# Patient Record
Sex: Male | Born: 1965 | Race: White | Hispanic: No | Marital: Married | Smoking: Current every day smoker
Health system: Southern US, Community
[De-identification: ages and names within clinical notes are randomized; demographics above are authoritative.]

## PROBLEM LIST (undated history)

## (undated) DIAGNOSIS — K219 Gastro-esophageal reflux disease without esophagitis: Secondary | ICD-10-CM

## (undated) DIAGNOSIS — Z720 Tobacco use: Secondary | ICD-10-CM

## (undated) DIAGNOSIS — M4180 Other forms of scoliosis, site unspecified: Secondary | ICD-10-CM

## (undated) DIAGNOSIS — G4733 Obstructive sleep apnea (adult) (pediatric): Secondary | ICD-10-CM

## (undated) DIAGNOSIS — Z955 Presence of coronary angioplasty implant and graft: Secondary | ICD-10-CM

## (undated) DIAGNOSIS — J449 Chronic obstructive pulmonary disease, unspecified: Secondary | ICD-10-CM

## (undated) DIAGNOSIS — I10 Essential (primary) hypertension: Secondary | ICD-10-CM

## (undated) DIAGNOSIS — I214 Non-ST elevation (NSTEMI) myocardial infarction: Secondary | ICD-10-CM

## (undated) DIAGNOSIS — I2510 Atherosclerotic heart disease of native coronary artery without angina pectoris: Secondary | ICD-10-CM

## (undated) DIAGNOSIS — E7800 Pure hypercholesterolemia, unspecified: Secondary | ICD-10-CM

## (undated) DIAGNOSIS — M1990 Unspecified osteoarthritis, unspecified site: Secondary | ICD-10-CM

## (undated) HISTORY — PX: BACK SURGERY: SHX140

## (undated) HISTORY — PX: KNEE SURGERY: SHX244

## (undated) HISTORY — DX: Tobacco use: Z72.0

## (undated) HISTORY — DX: Atherosclerotic heart disease of native coronary artery without angina pectoris: I25.10

## (undated) HISTORY — DX: Obstructive sleep apnea (adult) (pediatric): G47.33

## (undated) HISTORY — DX: Gastro-esophageal reflux disease without esophagitis: K21.9

## (undated) HISTORY — DX: Non-ST elevation (NSTEMI) myocardial infarction: I21.4

## (undated) HISTORY — DX: Other forms of scoliosis, site unspecified: M41.80

## (undated) HISTORY — DX: Chronic obstructive pulmonary disease, unspecified: J44.9

## (undated) HISTORY — PX: JOINT REPLACEMENT: SHX530

---

## 2005-05-04 ENCOUNTER — Ambulatory Visit: Payer: Self-pay | Admitting: Nurse Practitioner

## 2007-06-17 ENCOUNTER — Ambulatory Visit: Payer: Self-pay | Admitting: Internal Medicine

## 2007-08-21 ENCOUNTER — Encounter: Payer: Self-pay | Admitting: Family Medicine

## 2007-08-22 ENCOUNTER — Encounter: Payer: Self-pay | Admitting: Family Medicine

## 2007-09-22 ENCOUNTER — Encounter: Payer: Self-pay | Admitting: Family Medicine

## 2008-12-13 ENCOUNTER — Emergency Department: Payer: Self-pay | Admitting: Internal Medicine

## 2010-01-01 ENCOUNTER — Emergency Department: Payer: Self-pay | Admitting: Unknown Physician Specialty

## 2010-04-04 ENCOUNTER — Emergency Department: Payer: Self-pay | Admitting: Emergency Medicine

## 2010-08-31 ENCOUNTER — Emergency Department: Payer: Self-pay | Admitting: Emergency Medicine

## 2011-01-02 DIAGNOSIS — J159 Unspecified bacterial pneumonia: Secondary | ICD-10-CM | POA: Insufficient documentation

## 2011-01-02 DIAGNOSIS — E782 Mixed hyperlipidemia: Secondary | ICD-10-CM | POA: Insufficient documentation

## 2011-01-02 DIAGNOSIS — K2100 Gastro-esophageal reflux disease with esophagitis, without bleeding: Secondary | ICD-10-CM | POA: Insufficient documentation

## 2011-04-27 ENCOUNTER — Inpatient Hospital Stay: Payer: Self-pay | Admitting: Student

## 2011-04-27 DIAGNOSIS — R079 Chest pain, unspecified: Secondary | ICD-10-CM

## 2011-04-28 ENCOUNTER — Inpatient Hospital Stay (HOSPITAL_COMMUNITY)
Admission: AD | Admit: 2011-04-28 | Discharge: 2011-05-05 | DRG: 109 | Disposition: A | Discharge status: Home / Self Care | Payer: BC Managed Care – PPO | Source: Other Acute Inpatient Hospital | Attending: Cardiothoracic Surgery | Admitting: Cardiothoracic Surgery

## 2011-04-28 DIAGNOSIS — J4489 Other specified chronic obstructive pulmonary disease: Secondary | ICD-10-CM | POA: Diagnosis present

## 2011-04-28 DIAGNOSIS — R079 Chest pain, unspecified: Secondary | ICD-10-CM

## 2011-04-28 DIAGNOSIS — I2510 Atherosclerotic heart disease of native coronary artery without angina pectoris: Secondary | ICD-10-CM | POA: Diagnosis present

## 2011-04-28 DIAGNOSIS — K219 Gastro-esophageal reflux disease without esophagitis: Secondary | ICD-10-CM | POA: Diagnosis present

## 2011-04-28 DIAGNOSIS — J449 Chronic obstructive pulmonary disease, unspecified: Secondary | ICD-10-CM | POA: Diagnosis present

## 2011-04-28 DIAGNOSIS — F17200 Nicotine dependence, unspecified, uncomplicated: Secondary | ICD-10-CM | POA: Diagnosis present

## 2011-04-28 DIAGNOSIS — E8779 Other fluid overload: Secondary | ICD-10-CM | POA: Diagnosis not present

## 2011-04-28 DIAGNOSIS — G4733 Obstructive sleep apnea (adult) (pediatric): Secondary | ICD-10-CM | POA: Diagnosis present

## 2011-04-28 DIAGNOSIS — D62 Acute posthemorrhagic anemia: Secondary | ICD-10-CM | POA: Diagnosis not present

## 2011-04-28 DIAGNOSIS — I214 Non-ST elevation (NSTEMI) myocardial infarction: Principal | ICD-10-CM | POA: Diagnosis present

## 2011-04-28 DIAGNOSIS — D696 Thrombocytopenia, unspecified: Secondary | ICD-10-CM | POA: Diagnosis not present

## 2011-04-28 LAB — PROTIME-INR
INR: 0.95 (ref 0.00–1.49)
Prothrombin Time: 12.9 seconds (ref 11.6–15.2)

## 2011-04-28 LAB — CBC
Platelets: 181 10*3/uL (ref 150–400)
RBC: 4.68 MIL/uL (ref 4.22–5.81)
RDW: 12.6 % (ref 11.5–15.5)
WBC: 9.3 10*3/uL (ref 4.0–10.5)

## 2011-04-28 LAB — APTT: aPTT: 29 seconds (ref 24–37)

## 2011-04-28 LAB — DIFFERENTIAL
Basophils Absolute: 0.1 10*3/uL (ref 0.0–0.1)
Eosinophils Absolute: 0.3 10*3/uL (ref 0.0–0.7)
Eosinophils Relative: 3 % (ref 0–5)
Lymphs Abs: 1.9 10*3/uL (ref 0.7–4.0)
Neutrophils Relative %: 69 % (ref 43–77)

## 2011-04-29 DIAGNOSIS — I2510 Atherosclerotic heart disease of native coronary artery without angina pectoris: Secondary | ICD-10-CM

## 2011-04-29 LAB — URINALYSIS, MICROSCOPIC ONLY
Glucose, UA: NEGATIVE mg/dL
Ketones, ur: NEGATIVE mg/dL
Leukocytes, UA: NEGATIVE
Nitrite: NEGATIVE
Protein, ur: NEGATIVE mg/dL
Urobilinogen, UA: 1 mg/dL (ref 0.0–1.0)

## 2011-04-29 LAB — BASIC METABOLIC PANEL
Calcium: 8.6 mg/dL (ref 8.4–10.5)
Creatinine, Ser: 1.09 mg/dL (ref 0.50–1.35)
GFR calc Af Amer: >60 mL/min (ref >60)
GFR calc non Af Amer: >60 mL/min (ref >60)
Sodium: 140 mEq/L (ref 135–145)

## 2011-04-30 ENCOUNTER — Inpatient Hospital Stay (HOSPITAL_COMMUNITY): Payer: BC Managed Care – PPO

## 2011-04-30 DIAGNOSIS — Z01810 Encounter for preprocedural cardiovascular examination: Secondary | ICD-10-CM

## 2011-04-30 LAB — COMPREHENSIVE METABOLIC PANEL
Alkaline Phosphatase: 71 U/L (ref 39–117)
BUN: 18 mg/dL (ref 6–23)
Creatinine, Ser: 0.99 mg/dL (ref 0.50–1.35)
GFR calc Af Amer: >60 mL/min (ref >60)
Glucose, Bld: 115 mg/dL — ABNORMAL HIGH (ref 70–99)
Potassium: 4.1 mEq/L (ref 3.5–5.1)
Total Bilirubin: 0.3 mg/dL (ref 0.3–1.2)
Total Protein: 6.5 g/dL (ref 6.0–8.3)

## 2011-04-30 LAB — TYPE AND SCREEN: Antibody Screen: NEGATIVE

## 2011-04-30 LAB — BLOOD GAS, ARTERIAL
Acid-Base Excess: 3.5 mmol/L — ABNORMAL HIGH (ref 0.0–2.0)
Drawn by: 30575
O2 Saturation: 94.9 %
Patient temperature: 98.6
pO2, Arterial: 71.7 mmHg — ABNORMAL LOW (ref 80.0–100.0)

## 2011-04-30 LAB — LIPID PANEL: Cholesterol: 190 mg/dL (ref 0–200)

## 2011-05-01 ENCOUNTER — Inpatient Hospital Stay (HOSPITAL_COMMUNITY): Payer: BC Managed Care – PPO

## 2011-05-01 DIAGNOSIS — I2510 Atherosclerotic heart disease of native coronary artery without angina pectoris: Secondary | ICD-10-CM

## 2011-05-01 HISTORY — PX: CORONARY ARTERY BYPASS GRAFT: SHX141

## 2011-05-01 LAB — POCT I-STAT 3, ART BLOOD GAS (G3+)
Acid-Base Excess: 1 mmol/L (ref 0.0–2.0)
Bicarbonate: 26.8 mEq/L — ABNORMAL HIGH (ref 20.0–24.0)
Bicarbonate: 26.9 mEq/L — ABNORMAL HIGH (ref 20.0–24.0)
O2 Saturation: 100 %
O2 Saturation: 94 %
O2 Saturation: 92 %
Patient temperature: 37.7
TCO2: 28 mmol/L (ref 0–100)
TCO2: 30 mmol/L (ref 0–100)
TCO2: 28 mmol/L (ref 0–100)
pCO2 arterial: 56.3 mmHg — ABNORMAL HIGH (ref 35.0–45.0)
pCO2 arterial: 52.8 mmHg — ABNORMAL HIGH (ref 35.0–45.0)
pCO2 arterial: 52.4 mmHg — ABNORMAL HIGH (ref 35.0–45.0)
pCO2 arterial: 46.9 mmHg — ABNORMAL HIGH (ref 35.0–45.0)
pH, Arterial: 7.321 — ABNORMAL LOW (ref 7.350–7.450)
pH, Arterial: 7.335 — ABNORMAL LOW (ref 7.350–7.450)
pH, Arterial: 7.311 — ABNORMAL LOW (ref 7.350–7.450)
pH, Arterial: 7.337 — ABNORMAL LOW (ref 7.350–7.450)
pO2, Arterial: 255 mmHg — ABNORMAL HIGH (ref 80.0–100.0)
pO2, Arterial: 350 mmHg — ABNORMAL HIGH (ref 80.0–100.0)
pO2, Arterial: 144 mmHg — ABNORMAL HIGH (ref 80.0–100.0)
pO2, Arterial: 73 mmHg — ABNORMAL LOW (ref 80.0–100.0)
pO2, Arterial: 73 mmHg — ABNORMAL LOW (ref 80.0–100.0)

## 2011-05-01 LAB — SURGICAL PCR SCREEN
MRSA, PCR: NEGATIVE
Staphylococcus aureus: NEGATIVE

## 2011-05-01 LAB — POCT I-STAT 4, (NA,K, GLUC, HGB,HCT)
Glucose, Bld: 118 mg/dL — ABNORMAL HIGH (ref 70–99)
Glucose, Bld: 153 mg/dL — ABNORMAL HIGH (ref 70–99)
HCT: 45 % (ref 39.0–52.0)
HCT: 40 % (ref 39.0–52.0)
HCT: 42 % (ref 39.0–52.0)
Hemoglobin: 15.3 g/dL (ref 13.0–17.0)
Hemoglobin: 11.6 g/dL — ABNORMAL LOW (ref 13.0–17.0)
Hemoglobin: 17 g/dL (ref 13.0–17.0)
Hemoglobin: 10.9 g/dL — ABNORMAL LOW (ref 13.0–17.0)
Potassium: 5 mEq/L (ref 3.5–5.1)
Potassium: 6.5 mEq/L (ref 3.5–5.1)
Potassium: 4.4 mEq/L (ref 3.5–5.1)
Sodium: 134 mEq/L — ABNORMAL LOW (ref 135–145)
Sodium: 134 mEq/L — ABNORMAL LOW (ref 135–145)
Sodium: 139 mEq/L (ref 135–145)

## 2011-05-01 LAB — GLUCOSE, CAPILLARY
Glucose-Capillary: 93 mg/dL (ref 70–99)
Glucose-Capillary: 137 mg/dL — ABNORMAL HIGH (ref 70–99)

## 2011-05-01 LAB — CBC
HCT: 32.3 % — ABNORMAL LOW (ref 39.0–52.0)
HCT: 35.3 % — ABNORMAL LOW (ref 39.0–52.0)
Hemoglobin: 11.5 g/dL — ABNORMAL LOW (ref 13.0–17.0)
Hemoglobin: 12.4 g/dL — ABNORMAL LOW (ref 13.0–17.0)
MCH: 31.6 pg (ref 26.0–34.0)
MCH: 31.2 pg (ref 26.0–34.0)
MCHC: 35.6 g/dL (ref 30.0–36.0)
MCHC: 35.1 g/dL (ref 30.0–36.0)
MCV: 88.7 fL (ref 78.0–100.0)
MCV: 88.9 fL (ref 78.0–100.0)
Platelets: 118 10*3/uL — ABNORMAL LOW (ref 150–400)
Platelets: 145 10*3/uL — ABNORMAL LOW (ref 150–400)
RBC: 3.64 MIL/uL — ABNORMAL LOW (ref 4.22–5.81)
RBC: 3.97 MIL/uL — ABNORMAL LOW (ref 4.22–5.81)
RDW: 12.4 % (ref 11.5–15.5)
RDW: 12.4 % (ref 11.5–15.5)
WBC: 18.4 10*3/uL — ABNORMAL HIGH (ref 4.0–10.5)
WBC: 25.5 10*3/uL — ABNORMAL HIGH (ref 4.0–10.5)

## 2011-05-01 LAB — PROTIME-INR
INR: 1.89 — ABNORMAL HIGH (ref 0.00–1.49)
Prothrombin Time: 22 seconds — ABNORMAL HIGH (ref 11.6–15.2)

## 2011-05-01 LAB — POCT I-STAT, CHEM 8
HCT: 37 % — ABNORMAL LOW (ref 39.0–52.0)
Hemoglobin: 12.6 g/dL — ABNORMAL LOW (ref 13.0–17.0)
Potassium: 5 mEq/L (ref 3.5–5.1)
Sodium: 140 mEq/L (ref 135–145)
TCO2: 25 mmol/L (ref 0–100)

## 2011-05-01 LAB — MAGNESIUM: Magnesium: 3 mg/dL — ABNORMAL HIGH (ref 1.5–2.5)

## 2011-05-01 LAB — CREATININE, SERUM
Creatinine, Ser: 1 mg/dL (ref 0.50–1.35)
GFR calc Af Amer: >60 mL/min (ref >60)
GFR calc non Af Amer: >60 mL/min (ref >60)

## 2011-05-01 LAB — APTT: aPTT: 43 seconds — ABNORMAL HIGH (ref 24–37)

## 2011-05-01 NOTE — Consult Note (Signed)
NAME:  [name], [name] NO.:  [id number]  MEDICAL RECORD NO.:  [id number]  LOCATION:  2014                         FACILITY:  [facility]  PHYSICIAN:  Evelene Croon, M.D.     DATE OF BIRTH:  03/08/1966  DATE OF CONSULTATION:  04/29/2011 DATE OF DISCHARGE:                                CONSULTATION   REFERRING PHYSICIAN:  Madolyn Frieze. Jens Som, MD, [facility]  REASON FOR CONSULTATION:  Severe two-vessel coronary artery disease, status post non-ST-segment elevation MI.  CLINICAL HISTORY:  I was asked by Dr. Jens Som to evaluate [name] for consideration of coronary artery bypass graft surgery.  He is a 45 year old gentleman with a history of smoking, asthma, COPD, and sleep apnea, is untreated, who presents with a greater than 78-month history of intermittent substernal chest discomfort usually associated with some type of exertion.  Some of these episodes have been associated with radiation into his left arm and hand.  He had an episode a couple of weeks ago that occurred after he had just got in bed and lasted about 30 minutes.  Since then, he has had chest discomfort on a daily basis, usually occurring with exertion, but it does not have to be that much exertion such as putting his clothes on the morning.  He presented to the [facility] Emergency Room after he had 3 back- to-back episodes over a short period of time.  He did rule in for non-ST segment elevation MI with an initial troponin of 0.25, increasing to 0.4, and an initial CPK of 112 and MB of 2.0.  He underwent cardiac catheterization by Dr. Mariah Milling and this showed severe two-vessel disease. The LAD was occluded in its midportion with filling of the distal vessel by collaterals.  There is a moderate-sized diagonal branch that had high- grade proximal stenosis.  The left circumflex gave off a large bifurcating marginal in both the bifurcation and the proximal portion of the subbranches had 95%  stenoses in them.  The right coronary artery had no significant disease.  Left ventricular function was normal.  REVIEW OF SYSTEMS:  GENERAL:  He denies any fever or chills.  He has had no recent weight changes.  He does report mild fatigue.  EYES: Negative.  ENT:  Negative.  ENDOCRINE:  He denies diabetes and hypothyroidism.  CARDIOVASCULAR:  As above.  He denies exertional dyspnea.  He has had no PND or orthopnea.  He has had no peripheral edema or palpitations.  RESPIRATORY:  He denies cough and sputum production.  GI:  He has had no nausea or vomiting.  He denies melena or bright red blood per rectum.  GU:  He denies dysuria and hematuria. MUSCULOSKELETAL:  He denies arthralgias and myalgias.  NEUROLOGICAL:  He denies any focal weakness or numbness.  He denies dizziness or syncope. He has never had TIA or stroke.  VASCULAR:  He denies any claudication or phlebitis.  PSYCHIATRIC:  Negative.  ALLERGIES:  Negative.  PAST MEDICAL HISTORY:  Significant for asthma and COPD.  He has a history of sleep apnea, but it has not been treated.  He has a history of gastroesophageal reflux.  He is status post right knee arthroscopic surgery for an ACL tear.  MEDICATIONS PRIOR TO ADMISSION:  Albuterol, which he uses every 2-3 days, Advair, and Nexium.  SOCIAL HISTORY:  He is married.  He smokes about 1-1/2 packs of cigarettes per day and has since age 79.  He denies alcohol or drug use. He works Social worker.  FAMILY HISTORY:  Positive coronary artery disease in his mother and grandmother.  He has brothers with diabetes.  PHYSICAL EXAMINATION:  VITAL SIGNS:  Blood pressure is 102/68, pulse 68 and regular, respiratory rate 16 and unlabored. GENERAL:  He is a well-developed, white male in no distress. HEENT:  Normocephalic and atraumatic.  Pupils are equal, reactive to light accommodation.  Extraocular muscles are intact.  Throat is clear. NECK:  Normal carotid pulses bilaterally.   There are no bruits.  There is no adenopathy or thyromegaly. CARDIAC:  Regular rate and rhythm with normal S1 and S2.  There is no murmur or gallop. LUNGS:  Clear. ABDOMEN:  Active bowel sounds.  His abdomen is soft and nontender. There are no palpable masses or organomegaly. EXTREMITY:  No peripheral edema.  Pedal pulses are palpable bilaterally. SKIN:  Warm and dry. NEUROLOGIC:  Alert and oriented x3.  Motor and sensory exams grossly normal.  IMPRESSION AND PLAN:  [name] has severe two-vessel coronary artery disease, presenting with crescendo angina and non-ST-segment elevation myocardial infarction.  I agree that coronary artery bypass graft surgery is the best treatment rendered for ischemia and infarction.  I discussed the operative procedure with the patient and his family.  We discussed alternatives, benefits, and risks including but not limited to bleeding, blood transfusion, infection, stroke, myocardial infarction, graft failure, and death.  Also discussed the importance of maximum cardiac risk factor reduction including complete smoking cessation.  He understands all this and agrees to proceed.  I will schedule this for Dr. Tyrone Sage to do on Monday morning.  I discussed that plan with the patient's family and they are in full agreement with that.     Evelene Croon, M.D.     BB/MEDQ  D:  04/29/2011  T:  04/29/2011  Job:  [id number]  cc:   Antonieta Iba, MD  Electronically Signed by Evelene Croon M.D. on 05/01/2011 03:46:45 PM

## 2011-05-02 ENCOUNTER — Inpatient Hospital Stay (HOSPITAL_COMMUNITY): Payer: BC Managed Care – PPO

## 2011-05-02 LAB — GLUCOSE, CAPILLARY
Glucose-Capillary: 120 mg/dL — ABNORMAL HIGH (ref 70–99)
Glucose-Capillary: 144 mg/dL — ABNORMAL HIGH (ref 70–99)
Glucose-Capillary: 145 mg/dL — ABNORMAL HIGH (ref 70–99)
Glucose-Capillary: 151 mg/dL — ABNORMAL HIGH (ref 70–99)
Glucose-Capillary: 151 mg/dL — ABNORMAL HIGH (ref 70–99)

## 2011-05-02 LAB — MAGNESIUM
Magnesium: 2.6 mg/dL — ABNORMAL HIGH (ref 1.5–2.5)
Magnesium: 2.2 mg/dL (ref 1.5–2.5)

## 2011-05-02 LAB — POCT I-STAT, CHEM 8
Calcium, Ion: 1.14 mmol/L (ref 1.12–1.32)
Creatinine, Ser: 1 mg/dL (ref 0.50–1.35)
Glucose, Bld: 136 mg/dL — ABNORMAL HIGH (ref 70–99)
HCT: 37 % — ABNORMAL LOW (ref 39.0–52.0)
Hemoglobin: 12.6 g/dL — ABNORMAL LOW (ref 13.0–17.0)

## 2011-05-02 LAB — BASIC METABOLIC PANEL
BUN: 20 mg/dL (ref 6–23)
CO2: 27 mEq/L (ref 19–32)
Calcium: 7.8 mg/dL — ABNORMAL LOW (ref 8.4–10.5)
Chloride: 103 mEq/L (ref 96–112)
Creatinine, Ser: 1.1 mg/dL (ref 0.50–1.35)
GFR calc Af Amer: >60 mL/min (ref >60)
GFR calc non Af Amer: >60 mL/min (ref >60)
Glucose, Bld: 130 mg/dL — ABNORMAL HIGH (ref 70–99)
Potassium: 4.4 mEq/L (ref 3.5–5.1)
Sodium: 135 mEq/L (ref 135–145)

## 2011-05-02 LAB — CREATININE, SERUM
Creatinine, Ser: 0.99 mg/dL (ref 0.50–1.35)
GFR calc Af Amer: >60 mL/min (ref >60)
GFR calc non Af Amer: >60 mL/min (ref >60)

## 2011-05-02 LAB — POCT I-STAT 3, ART BLOOD GAS (G3+)
O2 Saturation: 89 %
Patient temperature: 38.2
TCO2: 27 mmol/L (ref 0–100)
pH, Arterial: 7.366 (ref 7.350–7.450)

## 2011-05-02 LAB — CBC
HCT: 34.9 % — ABNORMAL LOW (ref 39.0–52.0)
HCT: 34.5 % — ABNORMAL LOW (ref 39.0–52.0)
Hemoglobin: 11.9 g/dL — ABNORMAL LOW (ref 13.0–17.0)
Hemoglobin: 12.1 g/dL — ABNORMAL LOW (ref 13.0–17.0)
MCH: 30.5 pg (ref 26.0–34.0)
MCH: 31.7 pg (ref 26.0–34.0)
MCHC: 34.1 g/dL (ref 30.0–36.0)
MCHC: 35.1 g/dL (ref 30.0–36.0)
MCV: 89.5 fL (ref 78.0–100.0)
MCV: 90.3 fL (ref 78.0–100.0)
Platelets: 126 10*3/uL — ABNORMAL LOW (ref 150–400)
Platelets: 116 10*3/uL — ABNORMAL LOW (ref 150–400)
RBC: 3.9 MIL/uL — ABNORMAL LOW (ref 4.22–5.81)
RBC: 3.82 MIL/uL — ABNORMAL LOW (ref 4.22–5.81)
RDW: 12.6 % (ref 11.5–15.5)
RDW: 12.6 % (ref 11.5–15.5)
WBC: 18.5 10*3/uL — ABNORMAL HIGH (ref 4.0–10.5)
WBC: 20.7 10*3/uL — ABNORMAL HIGH (ref 4.0–10.5)

## 2011-05-03 ENCOUNTER — Inpatient Hospital Stay (HOSPITAL_COMMUNITY): Payer: BC Managed Care – PPO

## 2011-05-03 LAB — CBC
HCT: 32.3 % — ABNORMAL LOW (ref 39.0–52.0)
Hemoglobin: 11 g/dL — ABNORMAL LOW (ref 13.0–17.0)
MCH: 30.7 pg (ref 26.0–34.0)
MCHC: 34.1 g/dL (ref 30.0–36.0)
MCV: 90.2 fL (ref 78.0–100.0)
Platelets: 102 10*3/uL — ABNORMAL LOW (ref 150–400)
RBC: 3.58 MIL/uL — ABNORMAL LOW (ref 4.22–5.81)
RDW: 12.5 % (ref 11.5–15.5)
WBC: 15.8 10*3/uL — ABNORMAL HIGH (ref 4.0–10.5)

## 2011-05-03 LAB — BASIC METABOLIC PANEL
BUN: 19 mg/dL (ref 6–23)
CO2: 32 mEq/L (ref 19–32)
Calcium: 8.6 mg/dL (ref 8.4–10.5)
Chloride: 101 mEq/L (ref 96–112)
Creatinine, Ser: 1.03 mg/dL (ref 0.50–1.35)
GFR calc Af Amer: >60 mL/min (ref >60)
GFR calc non Af Amer: >60 mL/min (ref >60)
Glucose, Bld: 110 mg/dL — ABNORMAL HIGH (ref 70–99)
Potassium: 3.7 mEq/L (ref 3.5–5.1)
Sodium: 137 mEq/L (ref 135–145)

## 2011-05-03 LAB — GLUCOSE, CAPILLARY
Glucose-Capillary: 111 mg/dL — ABNORMAL HIGH (ref 70–99)
Glucose-Capillary: 138 mg/dL — ABNORMAL HIGH (ref 70–99)
Glucose-Capillary: 142 mg/dL — ABNORMAL HIGH (ref 70–99)

## 2011-05-04 ENCOUNTER — Inpatient Hospital Stay (HOSPITAL_COMMUNITY): Payer: BC Managed Care – PPO

## 2011-05-04 LAB — CBC
HCT: 32.8 % — ABNORMAL LOW (ref 39.0–52.0)
Hemoglobin: 11.2 g/dL — ABNORMAL LOW (ref 13.0–17.0)
MCH: 30.6 pg (ref 26.0–34.0)
MCHC: 34.1 g/dL (ref 30.0–36.0)
MCV: 89.6 fL (ref 78.0–100.0)
Platelets: 124 10*3/uL — ABNORMAL LOW (ref 150–400)
RBC: 3.66 MIL/uL — ABNORMAL LOW (ref 4.22–5.81)
RDW: 12.4 % (ref 11.5–15.5)
WBC: 13.6 10*3/uL — ABNORMAL HIGH (ref 4.0–10.5)

## 2011-05-04 LAB — GLUCOSE, CAPILLARY
Glucose-Capillary: 131 mg/dL — ABNORMAL HIGH (ref 70–99)
Glucose-Capillary: 146 mg/dL — ABNORMAL HIGH (ref 70–99)
Glucose-Capillary: 147 mg/dL — ABNORMAL HIGH (ref 70–99)

## 2011-05-04 LAB — BASIC METABOLIC PANEL
BUN: 17 mg/dL (ref 6–23)
CO2: 29 mEq/L (ref 19–32)
Calcium: 9.1 mg/dL (ref 8.4–10.5)
Chloride: 102 mEq/L (ref 96–112)
Creatinine, Ser: 0.93 mg/dL (ref 0.50–1.35)
GFR calc Af Amer: >60 mL/min (ref >60)
GFR calc non Af Amer: >60 mL/min (ref >60)
Glucose, Bld: 135 mg/dL — ABNORMAL HIGH (ref 70–99)
Potassium: 3.7 mEq/L (ref 3.5–5.1)
Sodium: 140 mEq/L (ref 135–145)

## 2011-05-08 NOTE — Discharge Summary (Signed)
NAMEMarland Kitchen  [name], [name] NO.:  [id number]  MEDICAL RECORD NO.:  [id number]  LOCATION:  2016                         FACILITY:  [facility]  PHYSICIAN:  Sheliah Plane, MD    DATE OF BIRTH:  1965-11-24  DATE OF ADMISSION:  04/28/2011 DATE OF DISCHARGE:  05/05/2011                              DISCHARGE SUMMARY   ADMITTING DIAGNOSES: 1. Non-ST-segment elevation myocardial infarction. 2. Multivessel coronary artery disease (preserved left ventricular     function). 3. History of chronic obstructive pulmonary disease. 4. History of asthma. 5. History obstructive sleep apnea. 6. History of tobacco abuse. 7. History of gastroesophageal reflux disease.  DISCHARGE DIAGNOSES: 1. Non-ST-segment elevation myocardial infarction. 2. Multivessel coronary artery disease (preserved left ventricular     function). 3. History of chronic obstructive pulmonary disease. 4. History of asthma. 5. History obstructive sleep apnea. 6. History of tobacco abuse. 7. History of gastroesophageal reflux disease. 8. Acute blood loss anemia. 9. Thrombocytopenia.  PROCEDURES: 1. Cardiac catheterization performed by Dr. Mariah Milling. 2. Median sternotomy for coronary artery bypass grafting x4 (left     internal mammary artery to left anterior descending, saphenous vein     graft to diagonal, saphenous vein graft sequentially to first     obtuse marginal artery and second obtuse marginal artery with     endoscopic vein harvesting from the right leg by Dr. Tyrone Sage on     May 01, 2011).  HISTORY OF PRESENT ILLNESS:  This is a 45 year old Caucasian male with the aforementioned past medical history who initially presented to [facility] Emergency room with complaints of substernal chest discomfort usually associated with exertion.  Apparently, this has been going on for at least the past month.  Some of these episodes have been associated with radiation of his chest pain to his left arm  and hand. He did have an episode a couple of weeks prior to this admission where he just got into bed and had approximately 30 minutes of chest pain.  He presented to [facility] Emergency Room on April 27, 2011, as he had had three back-to-back episodes of chest pain over a very short period of time.  Cardiac enzyme showed an initial troponin of 0.25 and increasing to 0.4.  He had a CPK of 112 and an MB of 2.  He ruled in for a non-STEMI.  He then underwent a cardiac catheterization by Dr. Mariah Milling which showed severe two-vessel disease and left ventricular function was normal.    The patient was transferred to [facility] via [facility] and a cardiothoracic consultation was obtained with Dr. Laneta Simmers initially.  Dr. Tyrone Sage, however, was available to do coronary artery bypass grafting surgery.  Potential risks, complications, and benefits of the surgery were discussed with the patient and he agreed to proceed. Prior to undergoing surgery, he did have a carotid duplex carotid ultrasound which showed no significant internal carotid artery stenosis. The patient underwent CABG x4 on May 01, 2011.  BRIEF HOSPITAL COURSE STAY:  The patient was extubated early the evening of surgery without difficulty.  He initially had a T-max of 100.2, but later became afebrile and he remained hemodynamically stable.  His Swan- Ganz, A-line, chest tubes, and Foley were all removed early in his postoperative course.  As previously stated, he has obstructive sleep apnea and CPAP was arranged at night; however, he did have difficulty with this as he felt as though "smothering."  He did wear oxygen at night and  he will need to follow up with a medical doctor for further treatment.  The patient initially required a couple liters of oxygen via nasal cannula (he has a history of COPD)  but he was able to be weaned to room air.  He was also given Xopenex daily.  He was found to have  acute blood loss anemia.  His H and H went as low as 11 and 32.3.  He did not require postoperative transfusion. He was placed on folic acid daily.  He was felt surgically stable for transfer from the Intensive Care into [facility] for further convalescence on May 03, 2011.  Currently, on postop day #3, he has already been tolerating a diet.  He has had a bowel movement.  He has no complaints. His T-max is 99.4, his heart rate is in the 110-120s (sinus tach, BP 116/63, O2 sat 91% on room air, preop weight is 81 kg, today's weight is down to 81.6 kg.  PHYSICAL EXAMINATION:  CARDIOVASCULAR:  Slightly tachycardic. PULMONARY:  Decreased at the bases. ABDOMEN:  Soft and nontender.  Occasional bowel sounds. EXTREMITIES:  Trace lower extremity edema. SKIN:  Sternal and right lower extremity wounds are clean, dry, and continuing to heal.  Regarding the patient's sinus tach, he is already on Lopressor 25 mg p.o. 2 times daily and it will be decided where or not his Lopressor should be increased.  With this history of COPD, we need to try to avoid increased risk of bronchospasm.  His potassium today was 3.7 and will be supplemented accordingly.  Provided he remains afebrile, hemodynamically stable, and pending morning round evaluation, he will be surgically stable for discharge on May 05, 2011.  Latest laboratory studies are as follows:  BMET done on May 04, 2011, potassium 3.7, sodium 140, BUN and 17 and 0.93 respectively.  CBC on this date,  H and H 11.2 and 32.8 respectively, white count down to 13,600, and platelet count up to 124,000.  Last chest x-ray done on May 04, 2011, shows low lung volumes with small bilateral pleural effusions and bibasilar atelectasis, left greater than right, no pneumothorax.  DISCHARGE INSTRUCTIONS:  Include the following:  Diet:  The patient should remain on a low-sodium, heart-healthy, low- sugar diet.  Activity:  He may walk up steps.   He may shower.  He is not to lift more than 10 pounds for 4 weeks.  He is not to drive until after 4 weeks.  He is to continue with his breathing exercise daily.  He is to walk daily and increase his frequency and duration as tolerates.  Wound care:  He is to use soap and water on his wounds.  He is to contact the office if any wound problems arise.  He was encouraged to stop smoking and was given a number in order to enroll in smoking cessation classes.  Followup appointments include: 1. The patient is to contact Dr. Windell Hummingbird office for followup     appointment in 2 weeks. 2. The patient is going to have an appointment to see Dr. Tyrone Sage in     approximately 3 weeks.  45 minutes prior to this office  appointment, chest x-ray will be obtained.  Appointment will be     made prior to his discharge.  MEDICATIONS UPON DISCHARGE: 1. Enteric-coated aspirin 325 mg p.o. daily. 2. Lasix 40 mg p.o. daily x4 days. 3. Potassium chloride 40 mEq p.o. daily x4 days. 4. Mucinex 600 mg p.o. 2 times daily for cough. 5. Lopressor 25 mg p.o. 2 times daily. 6. Pravastatin 40 mg p.o. at bedtime. 7. Ultram 50 mg 1-2 tablets p.o. q.4-6 h. p.r.n. pain. 8. Advair Diskus 100/50 one puff inhaled twice daily. 9. Nexium 40 mg p.o. daily.     Doree Fudge, PA   ______________________________ Sheliah Plane, MD    DZ/MEDQ  D:  05/04/2011  T:  05/04/2011  Job:  [id number]  cc:   Antonieta Iba, MD  Electronically Signed by Doree Fudge PA on 05/04/2011 03:19:33 PM Electronically Signed by Sheliah Plane MD on 05/08/2011 09:53:43 AM

## 2011-05-08 NOTE — Op Note (Signed)
NAME:  CARSON, BOGDEN NO.:  [id number]  MEDICAL RECORD NO.:  [id number]  LOCATION:                                 FACILITY:  PHYSICIAN:  Sheliah Plane, MD    DATE OF BIRTH:  Jun 30, 1966  DATE OF PROCEDURE:  05/01/2011 DATE OF DISCHARGE:                              OPERATIVE REPORT   PREOPERATIVE DIAGNOSIS:  Unstable angina with non-ST-segment elevation myocardial infarction.  POSTOPERATIVE DIAGNOSIS:  Unstable angina with non-ST-segment elevation myocardial infarction.  SURGICAL PROCEDURE:  Coronary artery bypass grafting x4 with left internal mammary to the left anterior descending coronary artery, reverse saphenous vein graft to the diagonal coronary artery, sequential reverse saphenous vein graft to the first and second obtuse marginal with right leg endo vein harvesting.  SURGEON:  Sheliah Plane, MD  FIRST ASSISTANCE:  Kerin Perna, MD  SECOND ASSISTANT:  Coral Ceo, PA  BRIEF HISTORY:  The patient is a 45 year old male with long-term history of smoking who presented to Dr. Mariah Milling in [place] with unstable anginal symptoms.  Cardiac enzymes were elevated.  He underwent cardiac catheterization, which demonstrated total occlusion of the LAD, high- grade stenosis of diagonal, high-grade stenosis of the circumflex at bifurcation of the small first and second obtuse marginal.  The right coronary artery was large vessel without significant disease.  There was collateral filling of the distal LAD from the right system.  Because of the patient's symptoms and significant coronary disease, coronary artery bypass grafting was recommended to the patient who agreed and signed informed consent.  DESCRIPTION OF PROCEDURE:  With Swan-Ganz and arterial line monitors in place, the patient underwent general endotracheal anesthesia without incident.  The skin in the chest and legs was prepped with Betadine and draped in usual sterile manner.  Using the  Guidant endo vein harvesting system, vein was harvested from the right thigh and calf and was of good quality.  Median sternotomy was performed.  Left internal mammary artery was dissected down to a specific graft.  The distal arteries had good free flow.  Pericardium was opened.  Overall ventricular function appeared preserved.  The patient was systemically heparinized. Ascending aorta was cannulated.  The right atrium was cannulated.  An aortic root vent cardioplegia needle was introduced into the ascending aorta.  The patient was placed on cardiopulmonary bypass 2.4 liters per minute per meter squared.  Sites of anastomosis were selected and dissected out of the epicardium.  The patient's body temperature was cooled to 32 degrees.  Aortic crossclamp was applied and 500 mL of cold blood potassium cardioplegia was administered with diastolic arrest of the heart, myocardial septal temperatures throughout the crossclamp. Attention was turned first to the OM1 and 2 vessels.  Both of these vessels were small 1.2 mm in size.  Using a side-to-side technique with 8-0 Prolene, the segment of reverse saphenous vein graft was anastomosed to the first obtuse marginal.  The distal saphenous vein was then trimmed to appropriate length and anastomosed to the second obtuse marginal.  The second obtuse marginal was even smaller, barely admitting a 1 mm probe.  Redo on either of these vessels will be difficult. Additional cold blood  cardioplegia was administered down the vein graft to the obtuse marginal vessels.  Attention was then turned to the diagonal coronary artery, which was 1.3-1.4 mm in size.  Using a running 7-0 Prolene, distal anastomosis was performed with a segment of reverse saphenous vein graft and a 7-0 Prolene.  Attention was then turned to the left anterior descending coronary artery, which was a small vessel, slightly smaller than the diagonal and was approximately 1.3-1.4 mm in size.   Using a running 8-0 Prolene, the left internal mammary artery was anastomosed to the left anterior descending coronary artery.  With release of the bulldog on the mammary artery, there was prompt rise in myocardial septal temperature.  The bulldog was placed back on the mammary artery and with crossclamp still in place, 2 punch aortotomies were performed and each of the 2 vein grafts were anastomosed to the ascending aorta.  Air was evacuated from the grafts and ascending aorta. Aortic crossclamp was removed.  Crossclamp time was 93 minutes.  Sites of anastomosis were inspected and were free of bleeding.  The patient spontaneously converted to a sinus rhythm.  He was rewarmed to 37 degrees, ventilated and weaned cardiopulmonary bypass without difficulty.  Total pump time was 119 minutes.  He remained hemodynamically stable.  He was decannulated.  Atrial and ventricular pacing wires were applied.  Protamine was administered.  Left pleural tube was left in place.  A Blake mediastinal drain was left in place. Pericardium was loosely reapproximated.  Sternum was closed with #6 stainless steel wire.  Fascia was closed with interrupted 0 Vicryl, running 3-0 Vicryl in subcutaneous tissue and 4-0 subcuticular stitches in skin edges.  Dry dressings were applied.  Sponge and needle count was reported as correct at the completion of the procedure.  The patient tolerated the procedure without obvious complication and was transferred to the surgical intensive care unit for further postoperative care.     Sheliah Plane, MD     EG/MEDQ  D:  05/03/2011  T:  05/03/2011  Job:  [id number]  cc:   Antonieta Iba, MD  Electronically Signed by Sheliah Plane MD on 05/08/2011 09:53:40 AM

## 2011-05-18 ENCOUNTER — Encounter: Payer: Self-pay | Admitting: *Deleted

## 2011-05-18 ENCOUNTER — Ambulatory Visit (INDEPENDENT_AMBULATORY_CARE_PROVIDER_SITE_OTHER): Payer: BC Managed Care – PPO | Admitting: Cardiovascular Disease

## 2011-05-18 ENCOUNTER — Encounter: Payer: Self-pay | Admitting: Cardiovascular Disease

## 2011-05-18 DIAGNOSIS — Z951 Presence of aortocoronary bypass graft: Secondary | ICD-10-CM | POA: Insufficient documentation

## 2011-05-18 DIAGNOSIS — Z87891 Personal history of nicotine dependence: Secondary | ICD-10-CM | POA: Insufficient documentation

## 2011-05-18 DIAGNOSIS — E785 Hyperlipidemia, unspecified: Secondary | ICD-10-CM | POA: Insufficient documentation

## 2011-05-18 NOTE — Assessment & Plan Note (Signed)
He is no longer smoking. We have Talked about the dangers of smoking should he restart.

## 2011-05-18 NOTE — Progress Notes (Signed)
Patient ID: [name], male    DOB: [birth year], 45 y.o.   MRN: [id number]  HPI Comments: 470 times with a long smoking history who presented to [facility] at the beginning of September with chest pain and shortness of breath. Cardiac catheterization showed severe multivessel disease and he was transferred to [facility] where he underwent bypass by Dr. Tyrone Sage on May 01 2011.  He had a coronary artery bypass grafting x4 (left internal mammary artery to left anterior descending, saphenous vein graft to diagonal, saphenous vein graft sequentially to first obtuse marginal artery and second obtuse marginal artery with  endoscopic vein harvesting from the right leg   He was discharged with no significant complications. He reports having a fever earlier this week on Sunday, 5 days ago with temperature to 102. He took Advil and his temperature resolved. He has not had any further symptoms of chills and fever since that time. He has been ambulating without any significant symptoms of shortness of breath or chest discomfort. Overall he is feeling much better. He does report having better energy. He is not smoking since the surgery.  EKG shows normal sinus rhythm with rate 76 beats per minute with no significant ST abnormality, he does have T-wave abnormality in lead one, aVL    Outpatient Encounter Prescriptions as of 05/18/2011  Medication Sig Dispense Refill  . aspirin 325 MG tablet Take 325 mg by mouth daily.        Marland Kitchen esomeprazole (NEXIUM) 40 MG capsule Take 40 mg by mouth daily before breakfast.        . Fluticasone-Salmeterol (ADVAIR) 100-50 MCG/DOSE AEPB Inhale 1 puff into the lungs every 12 (twelve) hours.        . folic acid (FOLVITE) 1 MG tablet Take 1 mg by mouth daily.        Marland Kitchen levalbuterol (XOPENEX HFA) 45 MCG/ACT inhaler Inhale 1-2 puffs into the lungs every 4 (four) hours as needed.        . metoprolol tartrate (LOPRESSOR) 25 MG tablet Take 25 mg by mouth 2 (two) times daily.        .  pravastatin (PRAVACHOL) 40 MG tablet Take 40 mg by mouth daily.        . traMADol (ULTRAM) 50 MG tablet Take 50 mg by mouth every 6 (six) hours as needed.           Review of Systems  Constitutional: Negative.   HENT: Negative.   Eyes: Negative.   Respiratory: Negative.   Cardiovascular: Negative.        Mild chest tenderness at the site of the incision  Gastrointestinal: Negative.   Musculoskeletal: Negative.   Skin: Negative.   Neurological: Negative.   Hematological: Negative.   Psychiatric/Behavioral: Negative.   All other systems reviewed and are negative.    BP 107/75  Pulse 78  Ht 5\' 8"  (1.727 m)  Wt 171 lb 12 oz (77.905 kg)  BMI 26.11 kg/m2  Physical Exam  Nursing note and vitals reviewed. Constitutional: He is oriented to person, place, and time. He appears well-developed and well-nourished.  HENT:  Head: Normocephalic.  Nose: Nose normal.  Mouth/Throat: Oropharynx is clear and moist.  Eyes: Conjunctivae are normal. Pupils are equal, round, and reactive to light.  Neck: Normal range of motion. Neck supple. No JVD present.  Cardiovascular: Normal rate, regular rhythm, S1 normal, S2 normal, normal heart sounds and intact distal pulses.  Exam reveals no gallop and no friction rub.   No  murmur heard.      Well-healed sternotomy site.  Pulmonary/Chest: Effort normal and breath sounds normal. No respiratory distress. He has no wheezes. He has no rales. He exhibits no tenderness.  Abdominal: Soft. Bowel sounds are normal. He exhibits no distension. There is no tenderness.  Musculoskeletal: Normal range of motion. He exhibits no edema and no tenderness.  Lymphadenopathy:    He has no cervical adenopathy.  Neurological: He is alert and oriented to person, place, and time. Coordination normal.  Skin: Skin is warm and dry. No rash noted. No erythema.  Psychiatric: He has a normal mood and affect. His behavior is normal. Judgment and thought content normal.            Assessment and Plan

## 2011-05-18 NOTE — Patient Instructions (Addendum)
You are doing well. No medication changes were made. Please come in for labs in December (fasting) to check cholesterol Please call us if you have new issues that need to be addressed before your next appt.  We will call you for a follow up Appt. In 6 months  Your physician recommends that you return for a FASTING lipid profile: December, 2012 (LIPID/LFT)

## 2011-05-18 NOTE — Assessment & Plan Note (Signed)
Cholesterol in the hospital was elevated. We have suggested we recheck this in December. Goal total cholesterol less than 150, LDL less than 70.

## 2011-05-18 NOTE — Assessment & Plan Note (Signed)
He is doing well following his bypass surgery. Blood pressure is borderline low and we will not add an ACE inhibitor at this time. If blood pressure improves and follow up with Dr. Tyrone Sage, possibly low-dose ACE could be added. We have asked him to continue his rehabilitation as he is walking on a daily basis. He does lift heavy items for work and we have asked him to talk with the surgical department to provide clearance.

## 2011-05-19 ENCOUNTER — Other Ambulatory Visit: Payer: Self-pay | Admitting: Cardiothoracic Surgery

## 2011-05-19 DIAGNOSIS — I2510 Atherosclerotic heart disease of native coronary artery without angina pectoris: Secondary | ICD-10-CM

## 2011-05-22 ENCOUNTER — Encounter: Payer: Self-pay | Admitting: Surgery

## 2011-05-22 ENCOUNTER — Encounter: Payer: Self-pay | Admitting: Cardiothoracic Surgery

## 2011-05-25 ENCOUNTER — Ambulatory Visit (INDEPENDENT_AMBULATORY_CARE_PROVIDER_SITE_OTHER): Payer: Self-pay | Admitting: Cardiothoracic Surgery

## 2011-05-25 ENCOUNTER — Encounter: Payer: Self-pay | Admitting: Cardiothoracic Surgery

## 2011-05-25 ENCOUNTER — Ambulatory Visit
Admission: RE | Admit: 2011-05-25 | Discharge: 2011-05-25 | Disposition: A | Discharge status: Home / Self Care | Payer: BC Managed Care – PPO | Source: Ambulatory Visit | Attending: Cardiothoracic Surgery | Admitting: Cardiothoracic Surgery

## 2011-05-25 VITALS — BP 99/58 | HR 78 | Resp 18 | Ht 69.0 in | Wt 178.0 lb

## 2011-05-25 DIAGNOSIS — I2510 Atherosclerotic heart disease of native coronary artery without angina pectoris: Secondary | ICD-10-CM

## 2011-05-25 DIAGNOSIS — Z951 Presence of aortocoronary bypass graft: Secondary | ICD-10-CM

## 2011-05-25 NOTE — Patient Instructions (Addendum)
CABG (Coronary Artery Bypass Grafting) Care After Recovery from any surgery will be different for everyone. Some people feel quite well after 3 or 4 weeks, while others take longer depending on their condition before surgery. These instructions are general guidelines on caring for yourself after you leave the hospital. Always follow your doctor's specific instructions.   IT MAY BE NORMAL TO:  Not have much appetite, feel nauseated by the smell of some foods or only want to eat one or two things.   Have swelling in your legs, especially if you have an incision. Keeping your legs up will help. Wear elastic or compression stockings if you have them.   Have trouble sleeping at night. Sometimes taking a pain pill before bedtime helps. You may not need as much sleep at night if you are napping during the day.   Be constipated because of changes in your diet, activity and medicines. Ask about stool softeners. Try to include more fiber, fruits and vegetables in your diet.   Feel sad or unhappy. You may be frustrated or cranky. You may have good days and bad days. Do not give up. Getting better takes time.   Have a lump at the top of your chest incision.   Experience muscle pain or tightness in your shoulders or upper back. Time and pain medicine may help this discomfort.   Have numbness to the side of your incision if an artery in your chest was used for bypass.   Need physical therapy if you have weakness or tingling in one or both of your arms.   Be confused or unable to think clearly if your CABG was done with a heart-lung machine. This usually gets better in 6 weeks or so.  MEDICINES  You should have a list of all the medicines you will be taking before you leave the hospital. Ask a nurse or pharmacist to help you.   For every medicine, the list should include:   Name.     Exact dose.     Time of day to be taken.     Any other details.      Be sure you understand this list.  Keep a copy with you at all times.   Do not add or stop taking any medicine until you check with your doctor.  Medicines may have side effects. Side effects are not the same as allergies. Call the doctor who prescribed the medicine if you:  Start throwing up, have diarrhea or stomach pain.   Feel dizzy or lightheaded when you stand up.   Are confused, have trouble walking or keep dropping things.   Feel that your heart is skipping beats, or beating too fast or too slow.   Develop a rash.   Notice unusual bruising or bleeding.  CARE OF YOUR CHEST INCISION  Tell your surgeon right away if you notice clicking in your chest (sternum).   Support your chest with a pillow or your arms when you cough and take deep breaths.   Follow instructions about when you may bathe or shower.   The tapes may fall off on their own. If they do not, you may gently peel them off after 7 days.   Tell your surgeon if you notice:   Increased tenderness of your incision.   More redness or swelling.   Drainage or pus.   Only use lotions, creams or oils if approved by your surgeon.   Protect your incision from sunlight during  the first year to keep the scar from getting darker.  CARE OF YOUR LEG INCISION(S)  Follow instructions about bathing and bandage changes.   If you have staples, they have to be removed by the home health nurse or at the clinic.   Avoid crossing your legs.   Change positions every half hour.   Elevate your leg(s) when you are sitting. You may put your legs on the arm of a sofa if you are lying down.   Check your leg(s) daily for swelling. Check the incisions for redness or drainage.   If you have them, wear your elastic stockings while you are up for at least 2 weeks. Take them off at bedtime. They will help reduce swelling.  DIET  Start making changes when your appetite is back to normal.   Most doctors advise a low fat, low salt diet to lower the risk of more heart  problems. You may be given specific goals for how much sodium and fat you should consume every day.   A dietician may help you learn how to plan meals and make better choices about what you eat and drink.  WEIGHT  Weighing yourself every day is important so you know if you are retaining fluid that may make your heart or lungs have to work harder. Use the same scale each time.   Weigh your self every morning at the same time after you go to the bathroom, but before breakfast.   Your weight will be more accurate if you do not wear any clothes.   Record your weight.   Tell your doctor if you have gained 2 pounds or more overnight.  ACTIVITY Stop any activity at once if you feel short of breath, notice irregular heart beats, have chest pain or feel faint or dizzy. Tell your doctor or get help right away if the problem does not go away when you stop. Showers. You may be able to take showers after your staples and pacing wires are out. Avoid soaking in a tub or near water jets until your incisions are healed. Rest. You need a balance of rest and activity. You can rest by sitting quietly or by napping if you feel tired. Walking. You will have started by in the hospital. Walk at your own pace and increase gradually. Wear sturdy shoes, not slippers. Dress for the weather if you walk outdoors. Rehab programs usually begin 4 weeks after surgery, but with the guidance of your doctors, you may begin to exercise sooner. Climbing stairs. Unless your doctor tells you not to, go up stairs slowly and rest if you tire. Do not pull yourself up by the handrail. Use your thigh muscles to lift your legs.   Driving a car. You may ride as a passenger at any time. To allow your chest to heal properly, avoid driving, motorcycle or outdoor bicycle riding for the first 8 weeks. When traveling, get out of the car and walk around for a few minutes every 2 hours. Lifting. Avoid lifting, pushing or pulling anything heavier than  10 pounds for 6 weeks after surgery. This includes carrying children, groceries, suitcases, mowing the grass and similar activities. Do not hold your breath during any activity, especially when lifting or using the rest room. Returning to work. Check with your surgeon. People heal at different rates, but most people will be able to go back to work 6 to 12 weeks after surgery if their job is not strenuous. Sexual. You may resume  sexual relations when you feel comfortable. This is 2 to 4 weeks after discharge for most people. Ask your doctor or nurse for detailed information. EXERCISE GUIDELINES Exercise guidelines are usually determined by a stress test done about 4 weeks after surgery. Heart rate limits are generally set at 70 percent of those reached on this first stress test.  Stop any exercise if you:   Have trouble breathing.   Get leg cramps.      Chest pain (angina).   Unusual fatigue.       Modify your next exercise session if your pulse after exercise is more than 30 beats faster than your resting rate.  CALL YOUR SURGEON     If any of your incisions are red, oozing, bleeding or the edges are separated.   There is a "clicking" in your sternum when you move.    A fever of 101.5 twice in 24 hours.    You have more ankle swelling, leg pain, or pain in your calf.    For any questions about your surgery.   CALL YOUR CARDIOLOGIST   If your heart beats are irregular and/or fast.   If you are more short of breath.    You have any questions about your medicines.    Weight gain of more than 2 pounds a day.    Feeling dizzy or lightheaded when you first stand up.    With questions about home health care or agencies in your community for help or transportation.    Painful, frequent or bloody urination.    GET IMMEDIATE MEDICAL HELP IF YOU HAVE:  Angina or chest pain that goes to your jaw or arms.   Sudden shortness of breath, that does not go away with rest.   Fast or  irregular heart beats and trouble breathing.   Numbness or weakness in your arms or legs.   Coughing up bright red blood.   Sudden severe headache.  Document Released: 02/24/2005 Document Re-Released: 01/25/2010 [facility] Patient Information 2011 [place], [place].  No lifting over 20 lbs three months from surgery May drive [place] ! NOT Smoking

## 2011-05-25 NOTE — Progress Notes (Signed)
                   [address]            [place] [address]          [phone]   Gabriel Cirri, MD Antonieta Iba, MD    HPI: Patient returns for routine postoperative follow-up having undergone coronary artery bypass grafting x4 with the left internal mammary to the left anterior descending coronary artery, reverse saphenous vein graft to the diagonal coronary artery, sequential reverse saphenous vein graft to the first and second obtuse marginals with right leg and the vein harvest on 05/01/2011.  The patient has been doing well postoperatively he's had no recurrent angina or evidence of congestive heart failure. Most notably both he and his wife stopped smoking and seem very motivated to continue their efforts.   Current Outpatient Prescriptions  Medication Sig Dispense Refill  . aspirin 325 MG tablet Take 325 mg by mouth daily.        Marland Kitchen esomeprazole (NEXIUM) 40 MG capsule Take 40 mg by mouth daily before breakfast.       . Fluticasone-Salmeterol (ADVAIR) 100-50 MCG/DOSE AEPB Inhale 1 puff into the lungs every 12 (twelve) hours.        . folic acid (FOLVITE) 1 MG tablet Take 1 mg by mouth daily.        Marland Kitchen levalbuterol (XOPENEX HFA) 45 MCG/ACT inhaler Inhale 1-2 puffs into the lungs every 4 (four) hours as needed.        . metoprolol tartrate (LOPRESSOR) 25 MG tablet Take 25 mg by mouth 2 (two) times daily.        . pravastatin (PRAVACHOL) 40 MG tablet Take 40 mg by mouth daily.        . traMADol (ULTRAM) 50 MG tablet Take 50 mg by mouth every 6 (six) hours as needed.          Physical Exam: BP 99/58  Pulse 78  Resp 18  Ht 5\' 9"  (1.753 m)  Wt 178 lb (80.74 kg)  BMI 26.29 kg/m2  SpO2 96% Patient isin no acute distress.  Skin is warm and dry. Color is normal.  HEENT is unremarkable. Normocephalic/atraumatic. PERRL. Sclera are nonicteric.  Neck is supple. No masses. No JVD. Lungs are clear.  Cardiac exam shows a regular rate and rhythm. The patient's  chest  incision is well-healed and bone stable.   Abdomen is soft.  Extremities are without edema.The right leg vein harvest sites well healed Gait and ROM are intact.  No gross neurologic deficits noted.  Diagnostic Tests:   chest x-ray is performed today that shows clear lung fields bilaterally   Impression: The patient is making excellent postoperative progress following coronary artery bypass grafting. In addition he and his wife have stopped smoking in were congratulated on her efforts.   Plan: Have allowed the patient to return to driving cautioned him about any heavy lifting over 25 pounds for 3 months from surgery. He seems very motivated and remains active, we discussed cardiac rehabilitation but he prefers to proceed on his. Discussed with him further  not to return to smoking, continue control of this cholesterol and continue on aspirin. I've not made him a return appointment to see me but would be glad to see him at his or Dr. Brynda Peon requested anytime.

## 2011-05-26 ENCOUNTER — Telehealth: Payer: Self-pay

## 2011-05-26 MED ORDER — METOPROLOL TARTRATE 25 MG PO TABS: 25.0000 mg | ORAL_TABLET | Freq: Two times a day (BID) | ORAL | Status: DC

## 2011-05-26 MED ORDER — PRAVASTATIN SODIUM 40 MG PO TABS: 40.0000 mg | ORAL_TABLET | Freq: Every day | ORAL | Status: DC

## 2011-05-26 NOTE — Telephone Encounter (Signed)
Requesting a refill for xopenex also.  Do you want to refill or check to see if patient has a pcp.

## 2011-05-26 NOTE — Telephone Encounter (Signed)
Refill request for pravastatin 40 mg one tablet daily & metoprolol 25 mg take one tablet bid to [facility] in [place].  The patient would like to know if you would refill Tramadol 50 mg?

## 2011-05-26 NOTE — Telephone Encounter (Signed)
The patient does not have a PCP yet. Please advise if okay to refill xopenx and tramadol.

## 2011-05-29 NOTE — Telephone Encounter (Signed)
Notified patient to take ibuprofen for pain.  Told the patient if not having any breathing difficulty per Dr.  Mariah Milling does not need to take the xopenex.

## 2011-06-06 ENCOUNTER — Other Ambulatory Visit: Payer: Self-pay

## 2011-06-06 ENCOUNTER — Telehealth: Payer: Self-pay

## 2011-06-06 DIAGNOSIS — Z951 Presence of aortocoronary bypass graft: Secondary | ICD-10-CM

## 2011-06-06 MED ORDER — TRAMADOL HCL 50 MG PO TABS: 50.0000 mg | ORAL_TABLET | Freq: Four times a day (QID) | ORAL | Status: DC | PRN

## 2011-06-06 NOTE — Telephone Encounter (Signed)
RX for Tramadol 50 mg 1 po every 6-8 hrs prn/pain #40 with 0 refill fax to [facility].

## 2011-07-19 ENCOUNTER — Encounter: Payer: Self-pay | Admitting: *Deleted

## 2011-08-02 ENCOUNTER — Encounter: Payer: Self-pay | Admitting: Cardiovascular Disease

## 2011-08-02 ENCOUNTER — Ambulatory Visit (INDEPENDENT_AMBULATORY_CARE_PROVIDER_SITE_OTHER): Payer: BC Managed Care – PPO | Admitting: Cardiovascular Disease

## 2011-08-02 ENCOUNTER — Ambulatory Visit (INDEPENDENT_AMBULATORY_CARE_PROVIDER_SITE_OTHER): Payer: BC Managed Care – PPO | Admitting: *Deleted

## 2011-08-02 VITALS — BP 100/70 | HR 75 | Ht 68.0 in | Wt 187.8 lb

## 2011-08-02 DIAGNOSIS — Z87891 Personal history of nicotine dependence: Secondary | ICD-10-CM

## 2011-08-02 DIAGNOSIS — E785 Hyperlipidemia, unspecified: Secondary | ICD-10-CM

## 2011-08-02 DIAGNOSIS — Z951 Presence of aortocoronary bypass graft: Secondary | ICD-10-CM

## 2011-08-02 DIAGNOSIS — R110 Nausea: Secondary | ICD-10-CM

## 2011-08-02 MED ORDER — PROMETHAZINE HCL 25 MG PO TABS: 25.0000 mg | ORAL_TABLET | Freq: Four times a day (QID) | ORAL | Status: DC | PRN

## 2011-08-02 NOTE — Assessment & Plan Note (Signed)
Doing well postoperatively after 3 months with continued chest pain with certain maneuvers and coughing. No significant shortness of breath. No medication changes made.

## 2011-08-02 NOTE — Assessment & Plan Note (Signed)
Etiology of his nausea that comes and goes is uncertain. He does have a history of GERD that has been quite severe. Symptoms are somewhat better on Nexium. We have suggested he propped the foot of his bed, that he try additional medications with his Nexium including Zantac, Pepcid or Mylanta. We have given him a prescription in for Phenergan. If symptoms persist, he could try to contact his primary care physician for GI workup and possible EGD.

## 2011-08-02 NOTE — Progress Notes (Signed)
Patient ID: [name], male    DOB: 1966/05/05, 45 y.o.   MRN: [id number]  HPI Comments: 45 year old male with a long smoking history who presented to [facility] at the beginning of September 2012 with chest pain and shortness of breath. Cardiac catheterization showed severe multivessel disease and he was transferred to [facility] where he underwent bypass by Dr. Tyrone Sage on May 01 2011.  He had a coronary artery bypass grafting x4 (left internal mammary artery to left anterior descending, saphenous vein graft to diagonal, saphenous vein graft sequentially to first obtuse marginal artery and second obtuse marginal artery with  endoscopic vein harvesting from the right leg   Since his discharge, he reports having nausea on a periodic basis. He has good exercise tolerance, is able to walk without chest pain or shortness of breath. He has good days and bad days. His bad days, he has nausea starting in the morning and he stays in bed. Nausea does not seem to be associated with food. No vomiting.  No lightheadedness or dizziness.  He does have some Discomfort in his chest/mediastinum with certain movement. 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  EKG shows normal sinus rhythm with rate 47 beats per minute with no significant ST abnormality, he does have T-wave abnormality in lead V6, one, aVL   Outpatient Encounter Prescriptions as of 08/02/2011  Medication Sig Dispense Refill  . aspirin 325 MG tablet Take 325 mg by mouth daily.        Marland Kitchen esomeprazole (NEXIUM) 40 MG capsule Take 40 mg by mouth daily before breakfast.       . Fluticasone-Salmeterol (ADVAIR) 100-50 MCG/DOSE AEPB Inhale 1 puff into the lungs every 12 (twelve) hours.        Marland Kitchen levalbuterol (XOPENEX HFA) 45 MCG/ACT inhaler Inhale 1-2 puffs into the lungs every 4 (four) hours as needed.        . metoprolol tartrate (LOPRESSOR) 25 MG tablet Take 1 tablet (25 mg  total) by mouth 2 (two) times daily.  60 tablet  6  . pravastatin (PRAVACHOL) 40 MG tablet Take 1 tablet (40 mg total) by mouth daily.  30 tablet  6  . traMADol (ULTRAM) 50 MG tablet Take 1 tablet (50 mg total) by mouth every 6 (six) hours as needed for pain.  40 tablet  0  . DISCONTD: folic acid (FOLVITE) 1 MG tablet Take 1 mg by mouth daily.           Review of Systems  Constitutional: Negative.   HENT: Negative.   Eyes: Negative.   Respiratory: Negative.   Cardiovascular: Positive for chest pain.       Mild chest tenderness at the site of the incision  Gastrointestinal: Positive for nausea.  Musculoskeletal: Negative.   Skin: Negative.   Neurological: Negative.   Hematological: Negative.   Psychiatric/Behavioral: Negative.   All other systems reviewed and are negative.    BP 100/70  Pulse 75  Ht 5\' 8"  (1.727 m)  Wt 187 lb 12 oz (85.163 kg)  BMI 28.55 kg/m2  Physical Exam  Nursing note and vitals reviewed. Constitutional: He is oriented to person, place, and time. He appears well-developed and well-nourished.  HENT:  Head: Normocephalic.  Nose: Nose normal.  Mouth/Throat: Oropharynx is clear and moist.  Eyes: Conjunctivae are normal. Pupils are equal, round, and reactive to light.  Neck: Normal range of motion. Neck supple. No JVD present.  Cardiovascular: Normal rate, regular rhythm, S1 normal, S2 normal, normal heart sounds and intact distal pulses.  Exam reveals no gallop and no friction rub.   No murmur heard.      Well-healed sternotomy site.  Pulmonary/Chest: Effort normal and breath sounds normal. No respiratory distress. He has no wheezes. He has no rales. He exhibits no tenderness.  Abdominal: Soft. Bowel sounds are normal. He exhibits no distension. There is no tenderness.  Musculoskeletal: Normal range of motion. He exhibits no edema and no tenderness.  Lymphadenopathy:    He has no cervical adenopathy.  Neurological: He is alert and oriented to person,  place, and time. Coordination normal.  Skin: Skin is warm and dry. No rash noted. No erythema.  Psychiatric: He has a normal mood and affect. His behavior is normal. Judgment and thought content normal.           Assessment and Plan

## 2011-08-02 NOTE — Assessment & Plan Note (Signed)
He has quit smoking, uses gum and candy.

## 2011-08-02 NOTE — Patient Instructions (Signed)
You are doing well.  Drop the aspirin down to 81 mg x 2 Raise the bed for reflux prevention Try the nausea medication  Please call us if you have new issues that need to be addressed before your next appt.  The office will contact you for a follow up Appt. In 6 months

## 2011-08-02 NOTE — Assessment & Plan Note (Signed)
Cholesterol pending from today. Goal LDL less than 70.

## 2011-08-03 LAB — LIPID PANEL
Cholesterol, Total: 164 mg/dL (ref 100–199)
LDL Calculated: 99 mg/dL (ref 0–99)

## 2011-08-03 LAB — HEPATIC FUNCTION PANEL
AST: 18 IU/L (ref 0–40)
Albumin: 4.3 g/dL (ref 3.5–5.5)
Total Bilirubin: 0.2 mg/dL (ref 0.0–1.2)

## 2011-08-03 NOTE — Progress Notes (Signed)
Addended by: Lanny Hurst E on: 08/03/2011 09:59 AM   Modules accepted: Orders

## 2011-08-10 ENCOUNTER — Encounter: Payer: Self-pay | Admitting: *Deleted

## 2011-08-10 ENCOUNTER — Telehealth: Payer: Self-pay

## 2011-08-10 DIAGNOSIS — Z951 Presence of aortocoronary bypass graft: Secondary | ICD-10-CM

## 2011-08-10 MED ORDER — TRAMADOL HCL 50 MG PO TABS: 50.0000 mg | ORAL_TABLET | Freq: Four times a day (QID) | ORAL | Status: DC | PRN

## 2011-08-10 NOTE — Telephone Encounter (Signed)
Refill per Dr. Mariah Milling okay to send this time for 30 day supply for tramadol. Told the patient need to contact Dr. Tyrone Sage for any additional refills on tramadol.

## 2011-08-22 HISTORY — PX: CARDIAC CATHETERIZATION: SHX172

## 2011-08-24 ENCOUNTER — Ambulatory Visit (INDEPENDENT_AMBULATORY_CARE_PROVIDER_SITE_OTHER): Payer: BC Managed Care – PPO | Admitting: Cardiothoracic Surgery

## 2011-08-24 ENCOUNTER — Encounter: Payer: Self-pay | Admitting: Cardiothoracic Surgery

## 2011-08-24 ENCOUNTER — Ambulatory Visit: Payer: BC Managed Care – PPO | Admitting: Cardiothoracic Surgery

## 2011-08-24 VITALS — BP 128/82 | HR 79 | Resp 16 | Ht 69.0 in | Wt 187.0 lb

## 2011-08-24 DIAGNOSIS — I2510 Atherosclerotic heart disease of native coronary artery without angina pectoris: Secondary | ICD-10-CM

## 2011-08-24 DIAGNOSIS — Z09 Encounter for follow-up examination after completed treatment for conditions other than malignant neoplasm: Secondary | ICD-10-CM

## 2011-08-24 NOTE — Patient Instructions (Signed)
May return to work 08/28/2011

## 2011-08-24 NOTE — Progress Notes (Signed)
[address]            [place] [address]          [phone]       Exavier Lina [facility] Medical Record #[id number] Date of Birth: 1965-09-16  Lewayne Bunting, MD Gabriel Cirri, MD  Chief Complaint:   PostOp Follow Up Visit   History of Present Illness:     Patient returns today for followup visit after coronary artery bypass grafting in September. 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 He's had no recurrent angina or evidence of congestive heart failure does still complain of some fatigue.  PREOPERATIVE DIAGNOSIS: Unstable angina with non-ST-segment elevation  myocardial infarction.  POSTOPERATIVE DIAGNOSIS: Unstable angina with non-ST-segment elevation  myocardial infarction.  SURGICAL PROCEDURE: Coronary artery bypass grafting x4 with left  internal mammary to the left anterior descending coronary artery,  reverse saphenous vein graft to the diagonal coronary artery, sequential  reverse saphenous vein graft to the first and second obtuse marginal  with right leg endo vein harvesting.            History  Smoking status  . Former Smoker -- 1.5 packs/day for 25 years  . Types: Cigarettes  . Quit date: 04/28/2011  Smokeless tobacco  . Not on file       Allergies  Allergen Reactions  . Percocet (Oxycodone-Acetaminophen)     Nausea/vomiting     Current Outpatient Prescriptions  Medication Sig Dispense Refill  . aspirin 325 MG tablet Take 325 mg by mouth daily.        Marland Kitchen esomeprazole (NEXIUM) 40 MG capsule Take 40 mg by mouth daily before breakfast.       . Fluticasone-Salmeterol (ADVAIR) 100-50 MCG/DOSE AEPB Inhale 1 puff into the lungs every 12 (twelve) hours.        Marland Kitchen levalbuterol (XOPENEX HFA) 45 MCG/ACT inhaler Inhale 1-2 puffs into the lungs every 4 (four) hours as needed.        . metoprolol tartrate (LOPRESSOR) 25 MG tablet Take 1 tablet (25 mg total) by mouth 2  (two) times daily.  60 tablet  6  . pravastatin (PRAVACHOL) 40 MG tablet Take 1 tablet (40 mg total) by mouth daily.  30 tablet  6  . traMADol (ULTRAM) 50 MG tablet Take 1 tablet (50 mg total) by mouth every 6 (six) hours as needed for pain.  30 tablet  0       Physical Exam: BP 128/82  Pulse 79  Resp 16  Ht 5\' 9"  (1.753 m)  Wt 187 lb (84.823 kg)  BMI 27.62 kg/m2  SpO2 98%  General appearance: alert and cooperative Neurologic: intact Heart: regular rate and rhythm, S1, S2 normal, no murmur, click, rub or gallop Lungs: clear to auscultation bilaterally Extremities: extremities normal, atraumatic, no cyanosis or edema, Homans sign is negative, no sign of DVT and no edema, redness or tenderness in the calves or thighs Wound: well healed sternum stable, very mionor keliod at base of sternal incesion Wounds:  Diagnostic Studies & Laboratory data:         Recent Radiology Findings: No results found.    Recent Labs: Lab Results  Component Value Date   WBC 13.6* 05/04/2011   HGB 11.2* 05/04/2011   HCT 32.8* 05/04/2011   PLT 124* 05/04/2011   GLUCOSE 135* 05/04/2011  CHOL 190 04/30/2011   TRIG 140 08/02/2011   HDL 37* 08/02/2011   LDLCALC 99 08/02/2011   ALT 25 08/02/2011   AST 18 08/02/2011   NA 140 05/04/2011   K 3.7 05/04/2011   CL 102 05/04/2011   CREATININE 0.93 05/04/2011   BUN 17 05/04/2011   CO2 29 05/04/2011   INR 1.89* 05/01/2011      Assessment / Plan:     patient is doing well following coronary bypass grafting. His sternum is stable and well-healed. I cleared him to return to work starting 08/28/2011. He still appears to be diligent about continuing with his medications including aspirin and statins. I've not made him a return appointment to see me but would be glad to see him at any time.        Delight Ovens MD 08/24/2011 10:37 AM

## 2011-08-29 ENCOUNTER — Other Ambulatory Visit: Payer: Self-pay | Admitting: Cardiovascular Disease

## 2011-08-29 DIAGNOSIS — E785 Hyperlipidemia, unspecified: Secondary | ICD-10-CM

## 2011-08-29 MED ORDER — ATORVASTATIN CALCIUM 80 MG PO TABS: 80.0000 mg | ORAL_TABLET | Freq: Every day | ORAL | Status: DC

## 2011-09-15 ENCOUNTER — Other Ambulatory Visit: Payer: Self-pay | Admitting: Cardiovascular Disease

## 2011-09-15 DIAGNOSIS — Z951 Presence of aortocoronary bypass graft: Secondary | ICD-10-CM

## 2011-09-15 NOTE — Telephone Encounter (Signed)
Will forward to provider and nurse for review.

## 2011-09-15 NOTE — Telephone Encounter (Signed)
Pt needs refill on medication tremedol they called yesterday and it still is not done/

## 2011-09-18 NOTE — Telephone Encounter (Signed)
LMOM TCB regarding tramadol.

## 2011-10-31 ENCOUNTER — Other Ambulatory Visit: Payer: Self-pay | Admitting: *Deleted

## 2011-10-31 MED ORDER — LEVALBUTEROL TARTRATE 45 MCG/ACT IN AERO: 1.0000 | INHALATION_SPRAY | RESPIRATORY_TRACT | Status: DC | PRN

## 2011-11-07 ENCOUNTER — Other Ambulatory Visit: Payer: BC Managed Care – PPO

## 2012-01-18 ENCOUNTER — Other Ambulatory Visit: Payer: Self-pay | Admitting: Cardiovascular Disease

## 2012-01-18 MED ORDER — ATORVASTATIN CALCIUM 80 MG PO TABS: 80.0000 mg | ORAL_TABLET | Freq: Every day | ORAL | Status: DC

## 2012-01-18 NOTE — Telephone Encounter (Signed)
Patient needs metoprol and lipitor reordered.  [name] in [place]

## 2012-01-29 ENCOUNTER — Encounter: Payer: Self-pay | Admitting: Cardiovascular Disease

## 2012-01-29 ENCOUNTER — Ambulatory Visit (INDEPENDENT_AMBULATORY_CARE_PROVIDER_SITE_OTHER): Payer: BC Managed Care – PPO | Admitting: Cardiovascular Disease

## 2012-01-29 VITALS — BP 128/78 | HR 88 | Ht 66.0 in | Wt 185.2 lb

## 2012-01-29 DIAGNOSIS — Z951 Presence of aortocoronary bypass graft: Secondary | ICD-10-CM

## 2012-01-29 DIAGNOSIS — Z87891 Personal history of nicotine dependence: Secondary | ICD-10-CM

## 2012-01-29 DIAGNOSIS — R0602 Shortness of breath: Secondary | ICD-10-CM

## 2012-01-29 DIAGNOSIS — R079 Chest pain, unspecified: Secondary | ICD-10-CM

## 2012-01-29 DIAGNOSIS — E785 Hyperlipidemia, unspecified: Secondary | ICD-10-CM

## 2012-01-29 MED ORDER — LISINOPRIL 10 MG PO TABS: 10.0000 mg | ORAL_TABLET | Freq: Every day | ORAL | Status: DC

## 2012-01-29 MED ORDER — TADALAFIL 5 MG PO TABS: 5.0000 mg | ORAL_TABLET | Freq: Every day | ORAL | Status: DC | PRN

## 2012-01-29 NOTE — Assessment & Plan Note (Addendum)
We'll encourage him to maintain smoking cessation as he is doing

## 2012-01-29 NOTE — Patient Instructions (Addendum)
You are doing well. Start lisinopril 10 mg daily  Try cilais and viagra  Please call us if you have new issues that need to be addressed before your next appt.  Your physician wants you to follow-up in: 6 months.  You will receive a reminder letter in the mail two months in advance. If you don't receive a letter, please call our office to schedule the follow-up appointment.

## 2012-01-29 NOTE — Progress Notes (Signed)
Patient ID: [name], male    DOB: June 04, 1966, 46 y.o.   MRN: [id number]  HPI Comments: 46 year old male with a long smoking history who presented to [facility] at the beginning of September 2012 with chest pain and shortness of breath. Cardiac catheterization showed severe multivessel disease and he was transferred to [facility] where he underwent bypass by Dr. Tyrone Sage on May 01 2011.  He had a coronary artery bypass grafting x4 (left internal mammary artery to left anterior descending, saphenous vein graft to diagonal, saphenous vein graft sequentially to first obtuse marginal artery and second obtuse marginal artery with  endoscopic vein harvesting from the right leg   Overall he reports that he is doing well. He has occasional sharp episodes in his chest. He does report an episode where he was lifting a heavy box and he felt a sharp pain below his ribs. He has had a abdominal pain after getting out of bed the past 2 mornings. This was better today. No significant shortness of breath or chest pain. He does not sleep well at nighttime. 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  EKG shows normal sinus rhythm with rate 88 beats per minute with no significant ST abnormality,  T-wave abnormality in lead V6, one, aVL   Outpatient Encounter Prescriptions as of 01/29/2012  Medication Sig Dispense Refill  . aspirin 325 MG tablet Take 325 mg by mouth daily.        Marland Kitchen atorvastatin (LIPITOR) 80 MG tablet Take 1 tablet (80 mg total) by mouth daily.  30 tablet  11  . esomeprazole (NEXIUM) 40 MG capsule Take 40 mg by mouth daily before breakfast.       . levalbuterol (XOPENEX HFA) 45 MCG/ACT inhaler Inhale 1-2 puffs into the lungs every 4 (four) hours as needed.  1 Inhaler  1  . metoprolol tartrate (LOPRESSOR) 25 MG tablet TAKE ONE TABLET BY MOUTH TWICE DAILY  60 tablet  2  . promethazine (PHENERGAN) 25 MG tablet Take 25 mg by mouth as needed.      Marland Kitchen lisinopril (PRINIVIL,ZESTRIL)  10 MG tablet Take 1 tablet (10 mg total) by mouth daily.  30 tablet  12  . tadalafil (CIALIS) 5 MG tablet Take 1 tablet (5 mg total) by mouth daily as needed for erectile dysfunction.  30 tablet  6    Review of Systems  Constitutional: Positive for fatigue.  HENT: Negative.   Eyes: Negative.   Respiratory: Negative.   Gastrointestinal: Positive for nausea.  Musculoskeletal: Negative.   Skin: Negative.   Neurological: Negative.   Hematological: Negative.   Psychiatric/Behavioral: Positive for sleep disturbance.  All other systems reviewed and are negative.    BP 128/78  Pulse 88  Ht 5\' 6"  (1.676 m)  Wt 185 lb 4 oz (84.029 kg)  BMI 29.90 kg/m2  Physical Exam  Nursing note and vitals reviewed. Constitutional: He is oriented to person, place, and time. He appears well-developed and well-nourished.  HENT:  Head: Normocephalic.  Nose: Nose normal.  Mouth/Throat: Oropharynx is clear and moist.  Eyes: Conjunctivae are normal. Pupils are equal, round, and reactive to light.  Neck: Normal range of motion. Neck supple. No JVD present.  Cardiovascular: Normal rate, regular rhythm, S1 normal, S2 normal, normal heart sounds and intact distal pulses.  Exam reveals no gallop and no friction rub.   No murmur heard.      Well-healed sternotomy site.  Pulmonary/Chest: Effort normal and breath sounds  normal. No respiratory distress. He has no wheezes. He has no rales. He exhibits no tenderness.  Abdominal: Soft. Bowel sounds are normal. He exhibits no distension. There is no tenderness.  Musculoskeletal: Normal range of motion. He exhibits no edema and no tenderness.  Lymphadenopathy:    He has no cervical adenopathy.  Neurological: He is alert and oriented to person, place, and time. Coordination normal.  Skin: Skin is warm and dry. No rash noted. No erythema.  Psychiatric: He has a normal mood and affect. His behavior is normal. Judgment and thought content normal.           Assessment  and Plan

## 2012-01-29 NOTE — Assessment & Plan Note (Addendum)
Currently with no symptoms of angina. No further workup at this time. We will add low-dose ACE inhibitor in addition to his beta blocker.

## 2012-01-29 NOTE — Assessment & Plan Note (Signed)
Cholesterol is at goal on the current lipid regimen. No changes to the medications were made.  

## 2012-02-21 ENCOUNTER — Telehealth: Payer: Self-pay | Admitting: Cardiovascular Disease

## 2012-02-21 NOTE — Telephone Encounter (Signed)
Pt needs script sent in for Viagra. [facility] in [place]

## 2012-02-21 NOTE — Telephone Encounter (Signed)
LMTCB pt requesting Viagra that is not on medication list.

## 2012-03-25 ENCOUNTER — Other Ambulatory Visit: Payer: Self-pay | Admitting: Cardiovascular Disease

## 2012-04-21 ENCOUNTER — Other Ambulatory Visit: Payer: Self-pay | Admitting: Cardiovascular Disease

## 2012-04-23 ENCOUNTER — Other Ambulatory Visit: Payer: Self-pay | Admitting: *Deleted

## 2012-04-23 MED ORDER — METOPROLOL TARTRATE 25 MG PO TABS: 25.0000 mg | ORAL_TABLET | Freq: Two times a day (BID) | ORAL | Status: DC

## 2012-04-23 NOTE — Telephone Encounter (Signed)
Refilled Metoprolol

## 2012-05-03 ENCOUNTER — Telehealth: Payer: Self-pay | Admitting: Cardiovascular Disease

## 2012-05-03 NOTE — Telephone Encounter (Signed)
Pt's wife states her husband was prescribed Cialis 5mg  with a coupon for 1 free month.  He would like it refilled, however, the meds will cost around $180.  He would like to know if the office has any discount coupons or knows of any way to obtain coupons for future refills.  Please call to advise.

## 2012-05-03 NOTE — Telephone Encounter (Signed)
Can you help?

## 2012-05-08 NOTE — Telephone Encounter (Signed)
LMOM with patient we do have a 30 day trial coupon for cialis if the patient would like to pick the coupon up, it will be with our front desk receptionist.

## 2012-11-11 ENCOUNTER — Emergency Department: Payer: Self-pay | Admitting: Emergency Medicine

## 2012-11-14 ENCOUNTER — Other Ambulatory Visit: Payer: Self-pay

## 2012-11-14 MED ORDER — METOPROLOL TARTRATE 25 MG PO TABS: 25.0000 mg | ORAL_TABLET | Freq: Two times a day (BID) | ORAL | Status: DC

## 2012-11-14 NOTE — Telephone Encounter (Signed)
Needs refill

## 2012-11-14 NOTE — Telephone Encounter (Signed)
Refilled Metoprolol sent to [facility].

## 2012-11-29 ENCOUNTER — Ambulatory Visit: Payer: Self-pay | Admitting: Orthopedic Surgery

## 2012-12-16 ENCOUNTER — Encounter: Payer: Self-pay | Admitting: Cardiovascular Disease

## 2012-12-16 ENCOUNTER — Ambulatory Visit (INDEPENDENT_AMBULATORY_CARE_PROVIDER_SITE_OTHER): Payer: BC Managed Care – PPO | Admitting: Cardiovascular Disease

## 2012-12-16 VITALS — BP 118/80 | HR 75 | Ht 69.0 in | Wt 191.5 lb

## 2012-12-16 DIAGNOSIS — Z951 Presence of aortocoronary bypass graft: Secondary | ICD-10-CM

## 2012-12-16 DIAGNOSIS — Z87891 Personal history of nicotine dependence: Secondary | ICD-10-CM

## 2012-12-16 DIAGNOSIS — M1990 Unspecified osteoarthritis, unspecified site: Secondary | ICD-10-CM | POA: Insufficient documentation

## 2012-12-16 DIAGNOSIS — R079 Chest pain, unspecified: Secondary | ICD-10-CM

## 2012-12-16 DIAGNOSIS — E785 Hyperlipidemia, unspecified: Secondary | ICD-10-CM

## 2012-12-16 MED ORDER — ESOMEPRAZOLE MAGNESIUM 40 MG PO CPDR: 40.0000 mg | DELAYED_RELEASE_CAPSULE | Freq: Every day | ORAL | Status: DC

## 2012-12-16 MED ORDER — TADALAFIL 20 MG PO TABS: 20.0000 mg | ORAL_TABLET | Freq: Every day | ORAL | Status: DC

## 2012-12-16 MED ORDER — ATORVASTATIN CALCIUM 80 MG PO TABS: 80.0000 mg | ORAL_TABLET | Freq: Every day | ORAL | Status: DC

## 2012-12-16 NOTE — Assessment & Plan Note (Signed)
Still smokes occasional cigarettes. Encouraged him to stop smoking

## 2012-12-16 NOTE — Assessment & Plan Note (Signed)
Currently with no symptoms of angina. No further workup at this time. Continue current medication regimen. 

## 2012-12-16 NOTE — Progress Notes (Signed)
Patient ID: [name], male    DOB: [birth year], 47 y.o.   MRN: [id number]  HPI Comments: 47 year old male with a long smoking history who presented to [facility] at the beginning of September 2012 with chest pain and shortness of breath. Cardiac catheterization showed severe multivessel disease and he was transferred to [facility] where he underwent bypass by Dr. Tyrone Sage on May 01 2011.  He had a coronary artery bypass grafting x4 (left internal mammary artery to left anterior descending, saphenous vein graft to diagonal, saphenous vein graft sequentially to first obtuse marginal artery and second obtuse marginal artery with  endoscopic vein harvesting from the right leg   He has not gone back to work for the past 3 weeks. Diagnosed with significant DJD, scoliosis, sciatica. L4-L5 neuroforaminal narrowing, spinal stenosis. Seen by Malachy Chamber in [place] for possible back surgery. Prior to recent back problems, is able to exert himself without any significant chest pain. Occasional twinges in his chest, not associated with exertion. He reports that he stopped his Lipitor some time back but does not know why.  EKG shows normal sinus rhythm with rate 75 beats per minute with no significant ST abnormality,  T-wave abnormality in lead V5 -V6, III   Outpatient Encounter Prescriptions as of 12/16/2012  Medication Sig Dispense Refill  . aspirin 325 MG tablet Take 325 mg by mouth daily.        Marland Kitchen esomeprazole (NEXIUM) 40 MG capsule Take 1 capsule (40 mg total) by mouth daily before breakfast.  30 capsule  11  . gabapentin (NEURONTIN) 300 MG capsule Take 300 mg by mouth 3 (three) times daily.      Marland Kitchen levalbuterol (XOPENEX HFA) 45 MCG/ACT inhaler Inhale 1-2 puffs into the lungs every 4 (four) hours as needed.  1 Inhaler  1  . meloxicam (MOBIC) 15 MG tablet Take 15 mg by mouth daily.      . metoprolol tartrate (LOPRESSOR) 25 MG tablet Take 1 tablet (25 mg total) by mouth 2 (two) times daily.  60  tablet  3  . promethazine (PHENERGAN) 25 MG tablet Take 1 tablet (25 mg total) by mouth every 6 (six) hours as needed for nausea.  30 tablet  1  . tadalafil (CIALIS) 20 MG tablet Take 1 tablet (20 mg total) by mouth daily.  4 tablet  6  . traMADol (ULTRAM) 50 MG tablet Take 50 mg by mouth every 6 (six) hours as needed for pain.      . [DISCONTINUED] esomeprazole (NEXIUM) 40 MG capsule Take 40 mg by mouth daily before breakfast.       . [DISCONTINUED] tadalafil (CIALIS) 5 MG tablet Take 1 tablet (5 mg total) by mouth daily as needed for erectile dysfunction.  30 tablet  6  . atorvastatin (LIPITOR) 80 MG tablet Take 1 tablet (80 mg total) by mouth daily.  30 tablet  11  . lisinopril (PRINIVIL,ZESTRIL) 10 MG tablet Take 1 tablet (10 mg total) by mouth daily.  30 tablet  12   Review of Systems  HENT: Negative.   Eyes: Negative.   Respiratory: Negative.   Musculoskeletal: Positive for back pain.  Skin: Negative.   Neurological: Negative.   Psychiatric/Behavioral: Positive for sleep disturbance.  All other systems reviewed and are negative.    BP 118/80  Pulse 75  Ht 5\' 9"  (1.753 m)  Wt 191 lb 8 oz (86.864 kg)  BMI 28.27 kg/m2  Physical Exam  Nursing note and vitals reviewed. Constitutional: He is  oriented to person, place, and time. He appears well-developed and well-nourished.  HENT:  Head: Normocephalic.  Nose: Nose normal.  Mouth/Throat: Oropharynx is clear and moist.  Eyes: Conjunctivae are normal. Pupils are equal, round, and reactive to light.  Neck: Normal range of motion. Neck supple. No JVD present.  Cardiovascular: Normal rate, regular rhythm, S1 normal, S2 normal, normal heart sounds and intact distal pulses.  Exam reveals no gallop and no friction rub.   No murmur heard. Well-healed sternotomy site.  Pulmonary/Chest: Effort normal and breath sounds normal. No respiratory distress. He has no wheezes. He has no rales. He exhibits no tenderness.  Abdominal: Soft. Bowel  sounds are normal. He exhibits no distension. There is no tenderness.  Musculoskeletal: Normal range of motion. He exhibits no edema and no tenderness.  Lymphadenopathy:    He has no cervical adenopathy.  Neurological: He is alert and oriented to person, place, and time. Coordination normal.  Skin: Skin is warm and dry. No rash noted. No erythema.  Psychiatric: He has a normal mood and affect. His behavior is normal. Judgment and thought content normal.      Assessment and Plan

## 2012-12-16 NOTE — Patient Instructions (Addendum)
You are doing well. Please restart generic lipitor 1/2 pill to start After a few weeks, increase back to a full pill  Please call us if you have new issues that need to be addressed before your next appt.  Your physician wants you to follow-up in: 6 months.  You will receive a reminder letter in the mail two months in advance. If you don't receive a letter, please call our office to schedule the follow-up appointment.

## 2012-12-16 NOTE — Assessment & Plan Note (Signed)
Currently not on a statin for unclear reasons. We'll restart generic Lipitor 40 mg daily titrating up to 80 mg daily if tolerated after several weeks

## 2012-12-16 NOTE — Assessment & Plan Note (Signed)
Severe DJD of his back, spinal stenosis. If surgery is needed, he would be acceptable risk. No further testing needed.

## 2012-12-17 ENCOUNTER — Ambulatory Visit: Payer: Self-pay | Admitting: Orthopedic Surgery

## 2012-12-17 ENCOUNTER — Telehealth: Payer: Self-pay

## 2012-12-17 NOTE — Telephone Encounter (Signed)
Pt wife called about Cialis and has some questions about the cost of this medication.Please call

## 2012-12-17 NOTE — Telephone Encounter (Signed)
Wife says they need prior auth for 2 RX Dr. Mariah Milling sent in yesterday: Nexium and Cialis Call [phone] opt #3, opt #1

## 2012-12-20 NOTE — Telephone Encounter (Signed)
Pt wife called back to see status of nexium prior auth. Please advise

## 2012-12-20 NOTE — Telephone Encounter (Signed)
Please advise 

## 2012-12-20 NOTE — Telephone Encounter (Signed)
Spoke with pt wife and she is aware that there is no prior authorization needed for nexium or cialis. Nexium and cialis refills have been sent in to their local pharmacy. She is also aware that if she would like to get cialis cheaper that her husband would have to go through his PCP in order to see if he has been diagnosed for BPH other than that they would have to pay normally what the usually pay for cialis. She understood and mentioned that it was ok.

## 2012-12-23 ENCOUNTER — Telehealth: Payer: Self-pay

## 2012-12-23 NOTE — Telephone Encounter (Signed)
Ok to hold the ASA prior to surg?

## 2012-12-23 NOTE — Telephone Encounter (Signed)
Okay to stop aspirin for back surgery When asked how many days they need Typically they want 10 days but depends on the surgery Acceptable risk for surgery

## 2012-12-23 NOTE — Telephone Encounter (Signed)
Will fax to surgeon

## 2012-12-23 NOTE — Telephone Encounter (Signed)
Pt wife states pt is having back surgery  And states they have told him to stop his aspirin. Please call

## 2013-02-06 ENCOUNTER — Telehealth: Payer: Self-pay

## 2013-02-06 ENCOUNTER — Other Ambulatory Visit: Payer: Self-pay | Admitting: Cardiovascular Disease

## 2013-02-06 NOTE — Telephone Encounter (Signed)
See below

## 2013-02-06 NOTE — Telephone Encounter (Signed)
Refilled Cialis sent to [facility] pharmacy.

## 2013-02-06 NOTE — Telephone Encounter (Signed)
Pt states ins will only cover 4 Cialis, pt would like different Rx. States dr Mariah Milling is aware of this.

## 2013-02-07 NOTE — Telephone Encounter (Signed)
Ok to change script. If he waits until Monday,  i have a coupon from a magazine for 30 pills of 5 mg free

## 2013-02-07 NOTE — Telephone Encounter (Signed)
Msg left with Dr Windell Hummingbird recommendations, told her to call back if it's not suitable to wait till Monday, number provided.

## 2013-02-10 NOTE — Telephone Encounter (Signed)
Per Dr. Mariah Milling we will call tomm when he brings coupon

## 2013-02-10 NOTE — Telephone Encounter (Signed)
Pt wife is calling back regarding "coupon for free medication". Please advise

## 2013-02-10 NOTE — Telephone Encounter (Signed)
See below

## 2013-02-10 NOTE — Telephone Encounter (Signed)
Wife informed we will have coupon at [facility] tomm Understanding verb  She also mentions pt is c/o feeling "bloated" all the time since starting atorvastatin 1/2 tablet She asks if this is a usual SE I explained this is not a common SE but I will let Dr. Mariah Milling know Also c/o dysuria. She asks if this is more r/t back issues he is having surg for I advised he f/u with PCP about this Understanding verb  Otherwise I wil make Dr. Mariah Milling aware of "bloated" feeling

## 2013-02-20 ENCOUNTER — Telehealth: Payer: Self-pay

## 2013-02-20 NOTE — Telephone Encounter (Signed)
lmtcb

## 2013-02-20 NOTE — Telephone Encounter (Signed)
Pt states when she took coupon to pharmacy for Cialis that dr Mariah Milling gave her last week, the pharmacist states the coupon had been used before and was unable to use it. Please call

## 2013-03-03 ENCOUNTER — Telehealth: Payer: Self-pay | Admitting: *Deleted

## 2013-03-03 NOTE — Telephone Encounter (Signed)
Spoke with wife concerning Cialis coupon. She mentioned that husband was unable to use coupon due to coupon already being used. She mentioned that husband was unable to get Cialis due to it being to expensive. Pt is aware that I will contact pharmacy to see what's going on with coupon and why pt can't use coupon.

## 2013-03-03 NOTE — Telephone Encounter (Signed)
Spoke with pt wife and she is aware that the coupon is no longer accepted due to code and to many people using actual code. Pt is aware that we don't have any new coupons available at this time and that there is no generic brand available for cialis. Pt is aware that I will put some sample up front for pick up.

## 2013-03-03 NOTE — Telephone Encounter (Signed)
Spoke with pharmacy and they mentioned that the reason why pt could not use coupon was because coupon has been used by to many people and they are no longer accepting those coupons due to code.

## 2013-03-03 NOTE — Telephone Encounter (Signed)
The free months supply coupon didn't go through...is there a generic?

## 2013-03-20 ENCOUNTER — Other Ambulatory Visit: Payer: Self-pay | Admitting: Cardiovascular Disease

## 2013-03-20 ENCOUNTER — Other Ambulatory Visit: Payer: Self-pay

## 2013-03-20 MED ORDER — METOPROLOL TARTRATE 25 MG PO TABS: ORAL_TABLET | ORAL | Status: DC

## 2013-03-20 NOTE — Telephone Encounter (Signed)
Refilled Metoprolol sent to [facility] pharmacy.

## 2013-04-24 ENCOUNTER — Telehealth: Payer: Self-pay

## 2013-04-24 NOTE — Telephone Encounter (Signed)
Request from Disability Determination Services, sent to [employer] on 04/24/2013 .  

## 2013-05-06 ENCOUNTER — Telehealth: Payer: Self-pay

## 2013-05-06 NOTE — Telephone Encounter (Signed)
Request from Disability Determination Services , sent to [employer] on 05/06/2013.   

## 2013-06-18 ENCOUNTER — Encounter: Payer: Self-pay | Admitting: Cardiovascular Disease

## 2013-06-18 ENCOUNTER — Ambulatory Visit (INDEPENDENT_AMBULATORY_CARE_PROVIDER_SITE_OTHER): Payer: BC Managed Care – PPO | Admitting: Cardiovascular Disease

## 2013-06-18 VITALS — BP 120/78 | HR 82 | Ht 69.0 in | Wt 209.0 lb

## 2013-06-18 DIAGNOSIS — Z87891 Personal history of nicotine dependence: Secondary | ICD-10-CM

## 2013-06-18 DIAGNOSIS — Z79899 Other long term (current) drug therapy: Secondary | ICD-10-CM

## 2013-06-18 DIAGNOSIS — M419 Scoliosis, unspecified: Secondary | ICD-10-CM

## 2013-06-18 DIAGNOSIS — Z9889 Other specified postprocedural states: Secondary | ICD-10-CM

## 2013-06-18 DIAGNOSIS — E785 Hyperlipidemia, unspecified: Secondary | ICD-10-CM

## 2013-06-18 DIAGNOSIS — Z951 Presence of aortocoronary bypass graft: Secondary | ICD-10-CM

## 2013-06-18 DIAGNOSIS — M4120 Other idiopathic scoliosis, site unspecified: Secondary | ICD-10-CM

## 2013-06-18 MED ORDER — METOPROLOL TARTRATE 25 MG PO TABS: ORAL_TABLET | ORAL | Status: DC

## 2013-06-18 MED ORDER — ESOMEPRAZOLE MAGNESIUM 40 MG PO CPDR: 40.0000 mg | DELAYED_RELEASE_CAPSULE | Freq: Every day | ORAL | Status: DC

## 2013-06-18 MED ORDER — CLOPIDOGREL BISULFATE 75 MG PO TABS: 75.0000 mg | ORAL_TABLET | Freq: Every day | ORAL | Status: DC

## 2013-06-18 NOTE — Assessment & Plan Note (Signed)
We placed an order for lipid panel at his convenience. Goal LDL less than 70

## 2013-06-18 NOTE — Assessment & Plan Note (Addendum)
Currently with no symptoms of angina. No further workup at this time. Continue current medication regimen. We will add Plavix daily in addition to his low-dose aspirin

## 2013-06-18 NOTE — Assessment & Plan Note (Signed)
He denies any recent smoking. Wife also has stopped smoking. He does use vapor cigarette

## 2013-06-18 NOTE — Patient Instructions (Addendum)
The phone number of the chronic pain clinic: [phone]  Please start plavix 75 mg once a day (platlet blocker)  Come in for labs in they next week or so,  fasting  Please call us if you have new issues that need to be addressed before your next appt.  Your physician wants you to follow-up in: 6 months.  You will receive a reminder letter in the mail two months in advance. If you don't receive a letter, please call our office to schedule the follow-up appointment.

## 2013-06-18 NOTE — Assessment & Plan Note (Signed)
Recent back surgery end of June 2014. Managed in [place]. Continues to have chronic pain.

## 2013-06-18 NOTE — Progress Notes (Signed)
Patient ID: [name], male    DOB: 1966/05/10, 47 y.o.   MRN: [id number]  HPI Comments: 47 year old male with a long smoking history with history of CAD, bypass surgery x4 in 2012, chronic back pain, status post recent back surgery, presents for routine followup .  He presented to [facility] at the beginning of September 2012 with chest pain and shortness of breath.  Cardiac catheterization showed severe multivessel disease and he was transferred to [facility] where he underwent bypass by Dr. Tyrone Sage on May 01 2011.  He had a coronary artery bypass grafting x4 (left internal mammary artery to left anterior descending, saphenous vein graft to diagonal, saphenous vein graft sequentially to first obtuse marginal artery and second obtuse marginal artery with  endoscopic vein harvesting from the right leg   He has  significant DJD, scoliosis, sciatica. L4-L5 neuroforaminal narrowing, spinal stenosis. Seen by Malachy Chamber in [place] with recent back surgery 02/17/2013 with L4-S1 laminectomy and fusion. Since then he has had chronic back pain. He has been trying to make you on 23 low-dose pain pills per day with significant difficulty. He reports a six-hour gap without any pain medication coverage during which time he lays flat and tries not to move secondary to pain.   No recent chest pain. He has been tolerating his Lipitor without any discomfort.  EKG shows normal sinus rhythm with rate 82 beats per minute with no significant ST abnormality   Outpatient Encounter Prescriptions as of 06/18/2013  Medication Sig Dispense Refill  . aspirin 81 MG tablet Take 81 mg by mouth daily.      Marland Kitchen atorvastatin (LIPITOR) 80 MG tablet Take 1 tablet (80 mg total) by mouth daily.  30 tablet  11  . diazepam (VALIUM) 5 MG tablet Take 5 mg by mouth 2 (two) times daily.      . DULoxetine (CYMBALTA) 30 MG capsule Take 30 mg by mouth 2 (two) times daily.      Marland Kitchen esomeprazole (NEXIUM) 40 MG capsule Take 1  capsule (40 mg total) by mouth daily before breakfast.  30 capsule  11  . levalbuterol (XOPENEX HFA) 45 MCG/ACT inhaler Inhale 1-2 puffs into the lungs every 4 (four) hours as needed.  1 Inhaler  1  . methocarbamol (ROBAXIN) 750 MG tablet Take 750 mg by mouth 3 (three) times daily.      . metoprolol tartrate (LOPRESSOR) 25 MG tablet TAKE 1 TABLET BY MOUTH TWICE DAILY  60 tablet  3  . NON FORMULARY Stool softner takes 1 tablet daily.      . ondansetron (ZOFRAN) 4 MG tablet Take 4 mg by mouth as needed for nausea.      . OXYCODONE HCL PO Take by mouth every 6 (six) hours as needed.      . pregabalin (LYRICA) 75 MG capsule Take 75 mg by mouth 2 (two) times daily.      . [DISCONTINUED] aspirin 325 MG tablet Take 325 mg by mouth daily.        . [DISCONTINUED] CIALIS 5 MG tablet TAKE 1 TABLET BY MOUTH AS NEEDED FOR ED  6 tablet  3  . [DISCONTINUED] gabapentin (NEURONTIN) 300 MG capsule Take 300 mg by mouth 3 (three) times daily.      . [DISCONTINUED] lisinopril (PRINIVIL,ZESTRIL) 10 MG tablet Take 1 tablet (10 mg total) by mouth daily.  30 tablet  12  . [DISCONTINUED] meloxicam (MOBIC) 15 MG tablet Take 15 mg by mouth daily.      . [  DISCONTINUED] promethazine (PHENERGAN) 25 MG tablet Take 1 tablet (25 mg total) by mouth every 6 (six) hours as needed for nausea.  30 tablet  1  . [DISCONTINUED] tadalafil (CIALIS) 20 MG tablet Take 1 tablet (20 mg total) by mouth daily.  4 tablet  6  . [DISCONTINUED] traMADol (ULTRAM) 50 MG tablet Take 50 mg by mouth every 6 (six) hours as needed for pain.       No facility-administered encounter medications on file as of 06/18/2013.    Review of Systems  Constitutional: Negative.   HENT: Negative.   Eyes: Negative.   Respiratory: Negative.   Cardiovascular: Negative.   Gastrointestinal: Negative.   Endocrine: Negative.   Musculoskeletal: Positive for back pain, gait problem and neck pain.  Skin: Negative.   Allergic/Immunologic: Negative.   Neurological:  Negative.   Hematological: Negative.   Psychiatric/Behavioral: Positive for sleep disturbance.  All other systems reviewed and are negative.    BP 120/78  Pulse 82  Ht 5\' 9"  (1.753 m)  Wt 209 lb (94.802 kg)  BMI 30.85 kg/m2  Physical Exam  Nursing note and vitals reviewed. Constitutional: He is oriented to person, place, and time. He appears well-developed and well-nourished.  HENT:  Head: Normocephalic.  Nose: Nose normal.  Mouth/Throat: Oropharynx is clear and moist.  Eyes: Conjunctivae are normal. Pupils are equal, round, and reactive to light.  Neck: Normal range of motion. Neck supple. No JVD present.  Cardiovascular: Normal rate, regular rhythm, S1 normal, S2 normal, normal heart sounds and intact distal pulses.  Exam reveals no gallop and no friction rub.   No murmur heard. Well-healed sternotomy site.  Pulmonary/Chest: Effort normal and breath sounds normal. No respiratory distress. He has no wheezes. He has no rales. He exhibits no tenderness.  Abdominal: Soft. Bowel sounds are normal. He exhibits no distension. There is no tenderness.  Musculoskeletal: Normal range of motion. He exhibits no edema and no tenderness.  Lymphadenopathy:    He has no cervical adenopathy.  Neurological: He is alert and oriented to person, place, and time. Coordination normal.  Skin: Skin is warm and dry. No rash noted. No erythema.  Psychiatric: He has a normal mood and affect. His behavior is normal. Judgment and thought content normal.      Assessment and Plan

## 2014-01-08 ENCOUNTER — Other Ambulatory Visit: Payer: Self-pay | Admitting: Cardiovascular Disease

## 2014-06-24 ENCOUNTER — Other Ambulatory Visit: Payer: Self-pay | Admitting: Cardiovascular Disease

## 2014-10-13 DIAGNOSIS — J449 Chronic obstructive pulmonary disease, unspecified: Secondary | ICD-10-CM | POA: Insufficient documentation

## 2014-12-17 DIAGNOSIS — M961 Postlaminectomy syndrome, not elsewhere classified: Secondary | ICD-10-CM | POA: Insufficient documentation

## 2014-12-17 DIAGNOSIS — G894 Chronic pain syndrome: Secondary | ICD-10-CM | POA: Insufficient documentation

## 2014-12-17 DIAGNOSIS — M4802 Spinal stenosis, cervical region: Secondary | ICD-10-CM | POA: Insufficient documentation

## 2015-03-10 DIAGNOSIS — F419 Anxiety disorder, unspecified: Secondary | ICD-10-CM | POA: Insufficient documentation

## 2015-03-10 DIAGNOSIS — F32A Depression, unspecified: Secondary | ICD-10-CM | POA: Insufficient documentation

## 2016-05-15 ENCOUNTER — Emergency Department: Payer: Medicare Other

## 2016-05-15 ENCOUNTER — Observation Stay
Admission: EM | Admit: 2016-05-15 | Discharge: 2016-05-16 | Disposition: A | Discharge status: Home / Self Care | Payer: Medicare Other | Attending: Internal Medicine | Admitting: Internal Medicine

## 2016-05-15 DIAGNOSIS — F329 Major depressive disorder, single episode, unspecified: Secondary | ICD-10-CM | POA: Diagnosis not present

## 2016-05-15 DIAGNOSIS — R079 Chest pain, unspecified: Secondary | ICD-10-CM | POA: Diagnosis present

## 2016-05-15 DIAGNOSIS — I252 Old myocardial infarction: Secondary | ICD-10-CM | POA: Diagnosis not present

## 2016-05-15 DIAGNOSIS — Z8249 Family history of ischemic heart disease and other diseases of the circulatory system: Secondary | ICD-10-CM | POA: Diagnosis not present

## 2016-05-15 DIAGNOSIS — Z79899 Other long term (current) drug therapy: Secondary | ICD-10-CM | POA: Insufficient documentation

## 2016-05-15 DIAGNOSIS — N400 Enlarged prostate without lower urinary tract symptoms: Secondary | ICD-10-CM | POA: Diagnosis not present

## 2016-05-15 DIAGNOSIS — I10 Essential (primary) hypertension: Secondary | ICD-10-CM | POA: Insufficient documentation

## 2016-05-15 DIAGNOSIS — I2510 Atherosclerotic heart disease of native coronary artery without angina pectoris: Secondary | ICD-10-CM | POA: Diagnosis not present

## 2016-05-15 DIAGNOSIS — Z951 Presence of aortocoronary bypass graft: Secondary | ICD-10-CM | POA: Insufficient documentation

## 2016-05-15 DIAGNOSIS — J40 Bronchitis, not specified as acute or chronic: Secondary | ICD-10-CM | POA: Diagnosis not present

## 2016-05-15 DIAGNOSIS — R05 Cough: Secondary | ICD-10-CM | POA: Diagnosis not present

## 2016-05-15 DIAGNOSIS — K219 Gastro-esophageal reflux disease without esophagitis: Secondary | ICD-10-CM | POA: Insufficient documentation

## 2016-05-15 DIAGNOSIS — Z955 Presence of coronary angioplasty implant and graft: Secondary | ICD-10-CM | POA: Diagnosis not present

## 2016-05-15 DIAGNOSIS — G8929 Other chronic pain: Secondary | ICD-10-CM | POA: Insufficient documentation

## 2016-05-15 DIAGNOSIS — G4733 Obstructive sleep apnea (adult) (pediatric): Secondary | ICD-10-CM | POA: Diagnosis not present

## 2016-05-15 DIAGNOSIS — R059 Cough, unspecified: Secondary | ICD-10-CM

## 2016-05-15 DIAGNOSIS — F17210 Nicotine dependence, cigarettes, uncomplicated: Secondary | ICD-10-CM | POA: Diagnosis not present

## 2016-05-15 DIAGNOSIS — Z885 Allergy status to narcotic agent status: Secondary | ICD-10-CM | POA: Diagnosis not present

## 2016-05-15 DIAGNOSIS — Z23 Encounter for immunization: Secondary | ICD-10-CM | POA: Diagnosis not present

## 2016-05-15 DIAGNOSIS — J432 Centrilobular emphysema: Secondary | ICD-10-CM

## 2016-05-15 DIAGNOSIS — M546 Pain in thoracic spine: Secondary | ICD-10-CM | POA: Diagnosis not present

## 2016-05-15 DIAGNOSIS — R918 Other nonspecific abnormal finding of lung field: Secondary | ICD-10-CM | POA: Diagnosis not present

## 2016-05-15 DIAGNOSIS — R0789 Other chest pain: Secondary | ICD-10-CM | POA: Diagnosis not present

## 2016-05-15 DIAGNOSIS — Z7982 Long term (current) use of aspirin: Secondary | ICD-10-CM | POA: Insufficient documentation

## 2016-05-15 DIAGNOSIS — R7989 Other specified abnormal findings of blood chemistry: Secondary | ICD-10-CM

## 2016-05-15 DIAGNOSIS — R0602 Shortness of breath: Secondary | ICD-10-CM | POA: Insufficient documentation

## 2016-05-15 DIAGNOSIS — J449 Chronic obstructive pulmonary disease, unspecified: Secondary | ICD-10-CM | POA: Insufficient documentation

## 2016-05-15 DIAGNOSIS — R778 Other specified abnormalities of plasma proteins: Secondary | ICD-10-CM | POA: Insufficient documentation

## 2016-05-15 DIAGNOSIS — I071 Rheumatic tricuspid insufficiency: Secondary | ICD-10-CM | POA: Insufficient documentation

## 2016-05-15 DIAGNOSIS — J810 Acute pulmonary edema: Secondary | ICD-10-CM | POA: Diagnosis present

## 2016-05-15 DIAGNOSIS — E785 Hyperlipidemia, unspecified: Secondary | ICD-10-CM | POA: Insufficient documentation

## 2016-05-15 HISTORY — DX: Essential (primary) hypertension: I10

## 2016-05-15 LAB — COMPREHENSIVE METABOLIC PANEL
ALT: 14 U/L — ABNORMAL LOW (ref 17–63)
ANION GAP: 9 (ref 5–15)
AST: 19 U/L (ref 15–41)
Albumin: 4 g/dL (ref 3.5–5.0)
Alkaline Phosphatase: 69 U/L (ref 38–126)
BILIRUBIN TOTAL: 0.5 mg/dL (ref 0.3–1.2)
BUN: 21 mg/dL — ABNORMAL HIGH (ref 6–20)
CO2: 23 mmol/L (ref 22–32)
Calcium: 8.9 mg/dL (ref 8.9–10.3)
Chloride: 109 mmol/L (ref 101–111)
Creatinine, Ser: 1.24 mg/dL (ref 0.61–1.24)
GFR calc Af Amer: >60 mL/min (ref >60)
Glucose, Bld: 111 mg/dL — ABNORMAL HIGH (ref 65–99)
POTASSIUM: 4 mmol/L (ref 3.5–5.1)
Sodium: 141 mmol/L (ref 135–145)
TOTAL PROTEIN: 7.1 g/dL (ref 6.5–8.1)

## 2016-05-15 LAB — TROPONIN I
TROPONIN I: 0.13 ng/mL — AB (ref <0.03)
TROPONIN I: 0.14 ng/mL — AB (ref <0.03)

## 2016-05-15 LAB — CBC WITH DIFFERENTIAL/PLATELET
BASOS PCT: 2 %
Basophils Absolute: 0.2 10*3/uL — ABNORMAL HIGH (ref 0–0.1)
EOS PCT: 4 %
Eosinophils Absolute: 0.3 10*3/uL (ref 0–0.7)
HEMATOCRIT: 45.7 % (ref 40.0–52.0)
Hemoglobin: 16 g/dL (ref 13.0–18.0)
Lymphocytes Relative: 24 %
Lymphs Abs: 1.8 10*3/uL (ref 1.0–3.6)
MCH: 31.9 pg (ref 26.0–34.0)
MCHC: 35 g/dL (ref 32.0–36.0)
MCV: 91.3 fL (ref 80.0–100.0)
MONO ABS: 0.7 10*3/uL (ref 0.2–1.0)
MONOS PCT: 9 %
NEUTROS ABS: 4.7 10*3/uL (ref 1.4–6.5)
Neutrophils Relative %: 61 %
PLATELETS: 183 10*3/uL (ref 150–440)
RBC: 5.01 MIL/uL (ref 4.40–5.90)
RDW: 14.2 % (ref 11.5–14.5)
WBC: 7.7 10*3/uL (ref 3.8–10.6)

## 2016-05-15 MED ORDER — DULOXETINE HCL 30 MG PO CPEP
60.0000 mg | ORAL_CAPSULE | Freq: Every day | ORAL | Status: DC
  Administered 2016-05-15: 60 mg via ORAL
  Filled 2016-05-15: qty 2

## 2016-05-15 MED ORDER — ALBUTEROL SULFATE (2.5 MG/3ML) 0.083% IN NEBU
2.5000 mg | INHALATION_SOLUTION | Freq: Two times a day (BID) | RESPIRATORY_TRACT | Status: DC
  Administered 2016-05-15: 2.5 mg via RESPIRATORY_TRACT
  Filled 2016-05-15: qty 3

## 2016-05-15 MED ORDER — ACETAMINOPHEN 650 MG RE SUPP: 650.0000 mg | Freq: Four times a day (QID) | RECTAL | Status: DC | PRN

## 2016-05-15 MED ORDER — INFLUENZA VAC SPLIT QUAD 0.5 ML IM SUSY
0.5000 mL | PREFILLED_SYRINGE | INTRAMUSCULAR | Status: AC
  Administered 2016-05-16: 0.5 mL via INTRAMUSCULAR
  Filled 2016-05-15: qty 0.5

## 2016-05-15 MED ORDER — SODIUM CHLORIDE 0.9 % IV BOLUS (SEPSIS)
500.0000 mL | Freq: Once | INTRAVENOUS | Status: AC
  Administered 2016-05-15: 500 mL via INTRAVENOUS

## 2016-05-15 MED ORDER — TAMSULOSIN HCL 0.4 MG PO CAPS
0.4000 mg | ORAL_CAPSULE | Freq: Every day | ORAL | Status: DC
  Administered 2016-05-16: 0.4 mg via ORAL
  Filled 2016-05-15: qty 1

## 2016-05-15 MED ORDER — ROSUVASTATIN CALCIUM 20 MG PO TABS
20.0000 mg | ORAL_TABLET | Freq: Every evening | ORAL | Status: DC
  Administered 2016-05-15: 20 mg via ORAL
  Filled 2016-05-15: qty 1

## 2016-05-15 MED ORDER — ALBUTEROL SULFATE (2.5 MG/3ML) 0.083% IN NEBU
2.5000 mg | INHALATION_SOLUTION | Freq: Two times a day (BID) | RESPIRATORY_TRACT | Status: DC
  Administered 2016-05-16: 2.5 mg via RESPIRATORY_TRACT
  Filled 2016-05-15: qty 3

## 2016-05-15 MED ORDER — LISINOPRIL 5 MG PO TABS
2.5000 mg | ORAL_TABLET | Freq: Every evening | ORAL | Status: DC
  Administered 2016-05-15: 2.5 mg via ORAL
  Filled 2016-05-15: qty 1

## 2016-05-15 MED ORDER — ACETAMINOPHEN 325 MG PO TABS: 650.0000 mg | ORAL_TABLET | Freq: Four times a day (QID) | ORAL | Status: DC | PRN

## 2016-05-15 MED ORDER — PANTOPRAZOLE SODIUM 40 MG PO TBEC
40.0000 mg | DELAYED_RELEASE_TABLET | Freq: Every day | ORAL | Status: DC
  Administered 2016-05-16: 40 mg via ORAL
  Filled 2016-05-15: qty 1

## 2016-05-15 MED ORDER — ONDANSETRON HCL 4 MG/2ML IJ SOLN: 4.0000 mg | Freq: Four times a day (QID) | INTRAMUSCULAR | Status: DC | PRN

## 2016-05-15 MED ORDER — OXYCODONE-ACETAMINOPHEN 5-325 MG PO TABS
ORAL_TABLET | ORAL | Status: AC
  Filled 2016-05-15: qty 1

## 2016-05-15 MED ORDER — ONDANSETRON HCL 4 MG PO TABS: 4.0000 mg | ORAL_TABLET | ORAL | Status: DC | PRN

## 2016-05-15 MED ORDER — TOPIRAMATE 25 MG PO TABS
50.0000 mg | ORAL_TABLET | Freq: Every evening | ORAL | Status: DC
  Administered 2016-05-15: 50 mg via ORAL
  Filled 2016-05-15: qty 2

## 2016-05-15 MED ORDER — ASPIRIN EC 81 MG PO TBEC
81.0000 mg | DELAYED_RELEASE_TABLET | Freq: Every day | ORAL | Status: DC
  Administered 2016-05-16: 81 mg via ORAL
  Filled 2016-05-15: qty 1

## 2016-05-15 MED ORDER — SODIUM CHLORIDE 0.9% FLUSH
3.0000 mL | Freq: Two times a day (BID) | INTRAVENOUS | Status: DC
  Administered 2016-05-15 – 2016-05-16 (×2): 3 mL via INTRAVENOUS

## 2016-05-15 MED ORDER — ONDANSETRON HCL 4 MG PO TABS: 4.0000 mg | ORAL_TABLET | Freq: Four times a day (QID) | ORAL | Status: DC | PRN

## 2016-05-15 MED ORDER — DOCUSATE SODIUM 100 MG PO CAPS
100.0000 mg | ORAL_CAPSULE | Freq: Two times a day (BID) | ORAL | Status: DC
  Administered 2016-05-15 – 2016-05-16 (×2): 100 mg via ORAL
  Filled 2016-05-15 (×2): qty 1

## 2016-05-15 MED ORDER — DULOXETINE HCL 30 MG PO CPEP
30.0000 mg | ORAL_CAPSULE | Freq: Every morning | ORAL | Status: DC
  Administered 2016-05-16: 30 mg via ORAL
  Filled 2016-05-15: qty 1

## 2016-05-15 MED ORDER — ASPIRIN 81 MG PO CHEW
CHEWABLE_TABLET | ORAL | Status: AC
  Filled 2016-05-15: qty 4

## 2016-05-15 MED ORDER — OXYCODONE-ACETAMINOPHEN 5-325 MG PO TABS
1.0000 | ORAL_TABLET | Freq: Once | ORAL | Status: AC
  Administered 2016-05-15: 1 via ORAL

## 2016-05-15 MED ORDER — TRAMADOL HCL 50 MG PO TABS
100.0000 mg | ORAL_TABLET | Freq: Four times a day (QID) | ORAL | Status: DC | PRN
  Administered 2016-05-15: 100 mg via ORAL
  Filled 2016-05-15: qty 2

## 2016-05-15 MED ORDER — METOPROLOL TARTRATE 25 MG PO TABS
25.0000 mg | ORAL_TABLET | Freq: Two times a day (BID) | ORAL | Status: DC
  Administered 2016-05-15: 25 mg via ORAL
  Filled 2016-05-15: qty 1

## 2016-05-15 MED ORDER — IOPAMIDOL (ISOVUE-370) INJECTION 76%
75.0000 mL | Freq: Once | INTRAVENOUS | Status: AC | PRN
  Administered 2016-05-15: 75 mL via INTRAVENOUS
  Filled 2016-05-15: qty 75

## 2016-05-15 MED ORDER — ENOXAPARIN SODIUM 40 MG/0.4ML ~~LOC~~ SOLN
40.0000 mg | SUBCUTANEOUS | Status: DC
  Administered 2016-05-15: 40 mg via SUBCUTANEOUS
  Filled 2016-05-15: qty 0.4

## 2016-05-15 MED ORDER — ASPIRIN 81 MG PO CHEW
324.0000 mg | CHEWABLE_TABLET | Freq: Once | ORAL | Status: AC
  Administered 2016-05-15: 324 mg via ORAL

## 2016-05-15 MED ORDER — NITROGLYCERIN 0.4 MG SL SUBL: 0.4000 mg | SUBLINGUAL_TABLET | SUBLINGUAL | Status: DC | PRN

## 2016-05-15 NOTE — H&P (Signed)
[employer] - [place] at [facility]   PATIENT NAME: [name]    MR#:  [id number]  DATE OF BIRTH:  May 16, 1966  DATE OF ADMISSION:  05/15/2016  PRIMARY CARE PHYSICIAN: Ulanda Edison, MD   REQUESTING/REFERRING PHYSICIAN: Dr. Ileana Roup  CHIEF COMPLAINT:   Chief Complaint  Patient presents with  . Pleurisy  . Cough    HISTORY OF PRESENT ILLNESS:  [name]  is a 50 y.o. male with a known history of COPD, GERD, ongoing tobacco abuse, history of coronary disease status post stent, obstructive sleep apnea who presents to the hospital complaining of exertional shortness of breath over the past few days. Patient says that he gets fairly winded upon ambulation short distances. He denies any chest pain, nausea, vomiting, diaphoresis, palpitations or syncope. He presented to the ER and was noted to have an elevated troponin of 0.13. He had no acute EKG changes. Given his long history of tobacco abuse and cardiac history hospitalist services were contacted further treatment and evaluation.  PAST MEDICAL HISTORY:   Past Medical History:  Diagnosis Date  . Acute non-ST segment elevation myocardial infarction (HCC)   . Asthma   . Asthma   . CAD, multiple vessel   . COPD (chronic obstructive pulmonary disease) (HCC)   . GERD (gastroesophageal reflux disease)   . Hypertension   . Levoscoliosis    multi level degenrative disc disease  . Obstructive sleep apnea   . Tobacco abuse     PAST SURGICAL HISTORY:   Past Surgical History:  Procedure Laterality Date  . BACK SURGERY     Degenerative Scoliosis T10-S1   . CARDIAC CATHETERIZATION  2013   right leg  . CORONARY ARTERY BYPASS GRAFT  05/01/2011   x 4  . KNEE SURGERY     right knee    SOCIAL HISTORY:   Social History  Substance Use Topics  . Smoking status: Former Smoker    Packs/day: 1.50    Years: 25.00    Types: Cigarettes    Quit date: 04/28/2011  . Smokeless tobacco: Not on file  . Alcohol use No     FAMILY HISTORY:   Family History  Problem Relation Age of Onset  . Heart attack Mother     DRUG ALLERGIES:   Allergies  Allergen Reactions  . Codeine Nausea And Vomiting    REVIEW OF SYSTEMS:   Review of Systems  Constitutional: Negative for fever and weight loss.  HENT: Negative for congestion, nosebleeds and tinnitus.   Eyes: Negative for blurred vision, double vision and redness.  Respiratory: Positive for shortness of breath. Negative for cough and hemoptysis.   Cardiovascular: Negative for chest pain, orthopnea, leg swelling and PND.  Gastrointestinal: Negative for abdominal pain, diarrhea, melena, nausea and vomiting.  Genitourinary: Negative for dysuria, hematuria and urgency.  Musculoskeletal: Negative for falls and joint pain.  Neurological: Negative for dizziness, tingling, sensory change, focal weakness, seizures, weakness and headaches.  Endo/Heme/Allergies: Negative for polydipsia. Does not bruise/bleed easily.  Psychiatric/Behavioral: Negative for depression and memory loss. The patient is not nervous/anxious.     MEDICATIONS AT HOME:   Prior to Admission medications   Medication Sig Start Date End Date Taking? Authorizing Provider  albuterol (PROVENTIL HFA;VENTOLIN HFA) 108 (90 Base) MCG/ACT inhaler Inhale 2 puffs into the lungs 2 (two) times daily.   Yes Historical Provider, MD  aspirin 81 MG tablet Take 81 mg by mouth daily.   Yes Historical Provider, MD  docusate sodium (COLACE) 100  MG capsule Take 100 mg by mouth 2 (two) times daily.   Yes Historical Provider, MD  DULoxetine (CYMBALTA) 30 MG capsule Take 30-60 mg by mouth 2 (two) times daily. Take 30mg  in morning and take 60mg  at night   Yes Historical Provider, MD  esomeprazole (NEXIUM) 40 MG capsule Take 1 capsule (40 mg total) by mouth daily before breakfast. 06/18/13  Yes , MD  lisinopril (PRINIVIL,ZESTRIL) 2.5 MG tablet Take 2.5 mg by mouth every evening.   Yes Historical Provider,  MD  metoprolol tartrate (LOPRESSOR) 25 MG tablet TAKE 1 TABLET BY MOUTH TWICE DAILY 06/18/13  Yes Antonieta Iba, MD  ondansetron (ZOFRAN) 4 MG tablet Take 4 mg by mouth as needed for nausea.   Yes Historical Provider, MD  rosuvastatin (CRESTOR) 20 MG tablet Take 20 mg by mouth every evening.   Yes Historical Provider, MD  tamsulosin (FLOMAX) 0.4 MG CAPS capsule Take 0.4 mg by mouth daily.   Yes Historical Provider, MD  topiramate (TOPAMAX) 25 MG tablet Take 50 mg by mouth every evening.   Yes Historical Provider, MD  atorvastatin (LIPITOR) 80 MG tablet Take 1 tablet (80 mg total) by mouth daily. 12/16/12 12/16/13  12/18/12, MD  atorvastatin (LIPITOR) 80 MG tablet TAKE 1 TABLET BY MOUTH EVERY DAY Patient not taking: Reported on 05/15/2016 01/08/14   05/17/2016, MD  clopidogrel (PLAVIX) 75 MG tablet Take 1 tablet (75 mg total) by mouth daily. Patient not taking: Reported on 05/15/2016 06/18/13   05/17/2016, MD  diazepam (VALIUM) 5 MG tablet Take 5 mg by mouth 2 (two) times daily.    Historical Provider, MD  levalbuterol [facility] HFA) 45 MCG/ACT inhaler Inhale 1-2 puffs into the lungs every 4 (four) hours as needed. Patient not taking: Reported on 05/15/2016 10/31/11   05/17/2016, MD  methocarbamol (ROBAXIN) 750 MG tablet Take 750 mg by mouth 3 (three) times daily.    Historical Provider, MD  NON FORMULARY Stool softner takes 1 tablet daily.    Historical Provider, MD  OXYCODONE HCL PO Take by mouth every 6 (six) hours as needed.    Historical Provider, MD  pregabalin (LYRICA) 75 MG capsule Take 75 mg by mouth 2 (two) times daily.    Historical Provider, MD      VITAL SIGNS:  Blood pressure 110/70, pulse 70, temperature 98.3 F (36.8 C), temperature source Oral, resp. rate 18, height 5\' 9"  (1.753 m), weight 89.4 kg (197 lb), SpO2 95 %.  PHYSICAL EXAMINATION:  Physical Exam  GENERAL:  50 y.o.-year-old patient lying in bed in no acute distress.  EYES: Pupils equal,  round, reactive to light and accommodation. No scleral icterus. Extraocular muscles intact.  HEENT: Head atraumatic, normocephalic. Oropharynx and nasopharynx clear. No oropharyngeal erythema, moist oral mucosa  NECK:  Supple, no jugular venous distention. No thyroid enlargement, no tenderness.  LUNGS: Normal breath sounds bilaterally, no wheezing, rales, rhonchi. No use of accessory muscles of respiration.  CARDIOVASCULAR: S1, S2 RRR. No murmurs, rubs, gallops, clicks.  ABDOMEN: Soft, nontender, nondistended. Bowel sounds present. No organomegaly or mass.  EXTREMITIES: No pedal edema, cyanosis, or clubbing. + 2 pedal & radial pulses b/l.   NEUROLOGIC: Cranial nerves II through XII are intact. No focal Motor or sensory deficits appreciated b/l PSYCHIATRIC: The patient is alert and oriented x 3. Good affect.  SKIN: No obvious rash, lesion, or ulcer.   LABORATORY PANEL:   CBC  Recent Labs Lab 05/15/16 1529  WBC 7.7  HGB 16.0  HCT 45.7  PLT 183   ------------------------------------------------------------------------------------------------------------------  Chemistries   Recent Labs Lab 05/15/16 1529  NA 141  K 4.0  CL 109  CO2 23  GLUCOSE 111*  BUN 21*  CREATININE 1.24  CALCIUM 8.9  AST 19  ALT 14*  ALKPHOS 69  BILITOT 0.5   ------------------------------------------------------------------------------------------------------------------  Cardiac Enzymes  Recent Labs Lab 05/15/16 1529  TROPONINI 0.13*   ------------------------------------------------------------------------------------------------------------------  RADIOLOGY:  Dg Chest 2 View  Result Date: 05/15/2016 CLINICAL DATA:  Right lateral/ posterior rib pain. Cough for 4 months. No acute injury. EXAM: CHEST  2 VIEW COMPARISON:  Radiographs 05/25/2011 and 05/04/2011. FINDINGS: The heart size and mediastinal contours are stable status post median sternotomy and CABG. There is mildly increased  interstitial prominence. The pulmonary vascularity appears normal. There is no confluent airspace opacity, pleural effusion or pneumothorax. No acute osseous findings are seen. IMPRESSION: Mildly increased interstitial prominence compared with previous studies from 2012, possibly technical. The pulmonary vascularity appears normal. No definite acute findings. Electronically Signed   By: Carey Bullocks M.D.   On: 05/15/2016 16:49   Ct Angio Chest Pe W Or Wo Contrast  Result Date: 05/15/2016 CLINICAL DATA:  Right lateral chest pain for 3 days. History of prior CABG. History of smoking. EXAM: CT ANGIOGRAPHY CHEST WITH CONTRAST TECHNIQUE: Multidetector CT imaging of the chest was performed using the standard protocol during bolus administration of intravenous contrast. Multiplanar CT image reconstructions and MIPs were obtained to evaluate the vascular anatomy. CONTRAST:  75 cc Isovue 370 intravenously. COMPARISON:  Chest radiograph 05/15/2016 FINDINGS: Cardiovascular: Satisfactory opacification of the pulmonary arteries to the segmental level. No evidence of pulmonary embolism. Normal heart size. No pericardial effusion. Postsurgical changes from prior CABG and coronary arterial stenting. Great vessels are normal in caliber. Mediastinum/Nodes: No enlarged mediastinal, hilar, or axillary lymph nodes. Thyroid gland, trachea, and esophagus demonstrate no significant findings. There are numerous shotty mediastinal and bilateral hilar lymph nodes, sub pathologic by size criteria. Lungs/Pleura: Mild interstitial pulmonary edema without evidence of pleural effusion. There is an element of mosaic profusion, suggestive of superimposed small airway disease. Upper Abdomen: No acute abnormality. Musculoskeletal: No chest wall abnormality. No acute or significant osseous findings. Review of the MIP images confirms the above findings. IMPRESSION: No evidence of pulmonary embolus. Number shotty mediastinal and bilateral hilar  lymph nodes, nonspecific. Mild pulmonary edema. Evidence of small airway disease. Electronically Signed   By: Ted Mcalpine M.D.   On: 05/15/2016 17:33     IMPRESSION AND PLAN:   50 year old male with past medical history of coronary disease status post stent, hypertension, COPD, GERD, ongoing tobacco abuse who presents to the hospital due to social shortness of breath and noted to have an elevated troponin.  1. Shortness of breath/elevated troponin-given the patient's risk factors and previous history of cardiac disease patient's shortness of breath could be anginal equivalent. -We will observe overnight on telemetry, cycle cardiac markers. -I will get a cardiology consult. Check a two-dimensional echocardiogram. -Continue aspirin, beta blocker, statin.  2. Essential hypertension-continue metoprolol, lisinopril.  3. Hyperlipidemia-continue Crestor.  4. GERD-continued Protonix.  5. BPH-continue Flomax.  6. Depression-continue Cymbalta.   All the records are reviewed and case discussed with ED provider. Management plans discussed with the patient, family and they are in agreement.  CODE STATUS: Full  TOTAL TIME TAKING CARE OF THIS PATIENT: 45 minutes.    Houston Siren M.D on 05/15/2016 at 6:35 PM  Between 7am to  6pm - Pager - [phone]  After 6pm go to [email or web address] - password [email or web address] [facility]  [facility]  Office  [phone]  CC: Primary care physician; Ulanda Edison, MD

## 2016-05-15 NOTE — ED Notes (Signed)
Dr. Alphonzo Lemmings notified of troponin 0.13.

## 2016-05-15 NOTE — ED Provider Notes (Addendum)
[facility] Emergency Department Provider Note  ____________________________________________   I have reviewed the triage vital signs and the nursing notes.   HISTORY  Chief Complaint Pleurisy and Cough    HPI [name]. is a 50 y.o. male with multiple medical problems and chronic pain issues. Patient states that he has had pain on the right side of his rib cage associated with cough for the last month or longer. He states he's lost some weight. He denies any fever. Denies productive cough. Patient does continue to smoke. He does have family history of cancer. No history of CHF that he knows of. Does have a history of CABG. The pain is in the right back ribs. He has no flank pain. He is not having diarrhea. He denies abdominal pain. Has been there worse when he changes position or takes a deep breath or coughs now for many weeks. Pain is sharp with no radiation. He has no pain unless he changes her aware "twisted" his back. It is worse when he touches it. Worse when he moves the wrong way or pick something up. It seemed to begin with a hard cough. She states she has been somewhat short of breath when he walks around more than normal.   Past Medical History:  Diagnosis Date  . Acute non-ST segment elevation myocardial infarction (HCC)   . Asthma   . Asthma   . CAD, multiple vessel   . COPD (chronic obstructive pulmonary disease) (HCC)   . GERD (gastroesophageal reflux disease)   . Hypertension   . Levoscoliosis    multi level degenrative disc disease  . Obstructive sleep apnea   . Tobacco abuse     Patient Active Problem List   Diagnosis Date Noted  . Previous back surgery 06/18/2013  . DJD (degenerative joint disease) 12/16/2012  . Nausea 08/02/2011  . 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 Smoking hx 05/18/2011  . Hyperlipidemia LDL goal <70 05/18/2011    Past Surgical History:  Procedure Laterality Date  . BACK SURGERY     Degenerative Scoliosis  T10-S1   . CARDIAC CATHETERIZATION  2013   right leg  . CORONARY ARTERY BYPASS GRAFT  05/01/2011   x 4  . KNEE SURGERY     right knee    Prior to Admission medications   Medication Sig Start Date End Date Taking? Authorizing Provider  aspirin 81 MG tablet Take 81 mg by mouth daily.    Historical Provider, MD  atorvastatin (LIPITOR) 80 MG tablet Take 1 tablet (80 mg total) by mouth daily. 12/16/12 12/16/13  Antonieta Iba, MD  atorvastatin (LIPITOR) 80 MG tablet TAKE 1 TABLET BY MOUTH EVERY DAY 01/08/14   Antonieta Iba, MD  clopidogrel (PLAVIX) 75 MG tablet Take 1 tablet (75 mg total) by mouth daily. 06/18/13   Antonieta Iba, MD  diazepam (VALIUM) 5 MG tablet Take 5 mg by mouth 2 (two) times daily.    Historical Provider, MD  DULoxetine (CYMBALTA) 30 MG capsule Take 30 mg by mouth 2 (two) times daily.    Historical Provider, MD  esomeprazole (NEXIUM) 40 MG capsule Take 1 capsule (40 mg total) by mouth daily before breakfast. 06/18/13   Antonieta Iba, MD  levalbuterol [facility] HFA) 45 MCG/ACT inhaler Inhale 1-2 puffs into the lungs every 4 (four) hours as needed. 10/31/11   Antonieta Iba, MD  methocarbamol (ROBAXIN) 750 MG tablet Take 750 mg by mouth 3 (three) times daily.  Historical Provider, MD  metoprolol tartrate (LOPRESSOR) 25 MG tablet TAKE 1 TABLET BY MOUTH TWICE DAILY 06/18/13   Antonieta Iba, MD  NON FORMULARY Stool softner takes 1 tablet daily.    Historical Provider, MD  ondansetron (ZOFRAN) 4 MG tablet Take 4 mg by mouth as needed for nausea.    Historical Provider, MD  OXYCODONE HCL PO Take by mouth every 6 (six) hours as needed.    Historical Provider, MD  pregabalin (LYRICA) 75 MG capsule Take 75 mg by mouth 2 (two) times daily.    Historical Provider, MD    Allergies Percocet [oxycodone-acetaminophen]  Family History  Problem Relation Age of Onset  . Heart attack Mother     Social History Social History  Substance Use Topics  . Smoking status: Former  Smoker    Packs/day: 1.50    Years: 25.00    Types: Cigarettes    Quit date: 04/28/2011  . Smokeless tobacco: Not on file  . Alcohol use No    Review of Systems Constitutional: No fever/chills Eyes: No visual changes. ENT: No sore throat. No stiff neck no neck pain Cardiovascular: See history of present illness  Respiratory: Has been having some shortness of breath. Positive occasional cough Gastrointestinal:   no vomiting.  No diarrhea.  No constipation. Genitourinary: Negative for dysuria. Musculoskeletal: Negative lower extremity swelling Skin: Negative for rash. Neurological: Negative for severe headaches, focal weakness or numbness. 10-point ROS otherwise negative.  ____________________________________________   PHYSICAL EXAM:  VITAL SIGNS: ED Triage Vitals  Enc Vitals Group     BP 05/15/16 1525 110/70     Pulse Rate 05/15/16 1525 70     Resp 05/15/16 1525 18     Temp 05/15/16 1525 98.3 F (36.8 C)     Temp Source 05/15/16 1525 Oral     SpO2 05/15/16 1525 95 %     Weight 05/15/16 1526 197 lb (89.4 kg)     Height 05/15/16 1526 5\' 9"  (1.753 m)     Head Circumference --      Peak Flow --      Pain Score 05/15/16 1526 10     Pain Loc --      Pain Edu? --      Excl. in GC? --     Constitutional: Alert and oriented. Well appearing and in no acute distress. Eyes: Conjunctivae are normal. PERRL. EOMI. Head: Atraumatic. Nose: No congestion/rhinnorhea. Mouth/Throat: Mucous membranes are moist.  Oropharynx non-erythematous. Neck: No stridor.   Nontender with no meningismus Cardiovascular: Normal rate, regular rhythm. Grossly normal heart sounds.  Good peripheral circulation. Respiratory: Normal respiratory effort.  No retractions. Lungs CTAB. Chest: Tender to palpation the right rib cage in the midaxillary line, when I touch this area patient states "ouch that's the pain right there. This around T4-T5. There is no flank pain below this area. There is no crepitus there  is no flail chest. Very reproducible chest wall pain. Abdominal: Soft and nontender. No distention. No guarding no rebound Back:  There is no focal tenderness or step off.  there is no midline tenderness there are no lesions noted. there is no CVA tenderness Musculoskeletal: No lower extremity tenderness, no upper extremity tenderness. No joint effusions, no DVT signs strong distal pulses no edema Neurologic:  Normal speech and language. No gross focal neurologic deficits are appreciated.  Skin:  Skin is warm, dry and intact. No rash noted. Psychiatric: Mood and affect are normal. Speech and behavior are normal.  ____________________________________________   LABS (all labs ordered are listed, but only abnormal results are displayed)  Labs Reviewed  CBC WITH DIFFERENTIAL/PLATELET - Abnormal; Notable for the following:       Result Value   Basophils Absolute 0.2 (*)    All other components within normal limits  COMPREHENSIVE METABOLIC PANEL - Abnormal; Notable for the following:    Glucose, Bld 111 (*)    BUN 21 (*)    ALT 14 (*)    All other components within normal limits  TROPONIN I   ____________________________________________  EKG  I personally interpreted any EKGs ordered by me or triage Normal sinus rhythm rate 71 bpm no acute ST elevation or acute ST depression normal axis unremarkable EKG ____________________________________________  RADIOLOGY  I reviewed any imaging ordered by me or triage that were performed during my shift and, if possible, patient and/or family made aware of any abnormal findings. ____________________________________________   PROCEDURES  Procedure(s) performed: None  Procedures  Critical Care performed: None  ____________________________________________   INITIAL IMPRESSION / ASSESSMENT AND PLAN / ED COURSE  Pertinent labs & imaging results that were available during my care of the patient were reviewed by me and considered in my  medical decision making (see chart for details).  Patient with very reproducible chest wall pain, concern does exist in this patient for cancer, as he does have a history of prolonged tobacco abuse as well as family history of cancer. I've advised him to quit smoking. Differential for his discomfort is broad. At this time I have low suspicion for PE but I did feel that a CT scan was warranted given his history to rule out a PE and for cancer to the extent that I can the emergency department.  ----------------------------------------- 5:42 PM on 05/15/2016 -----------------------------------------  CT shows mild pulmonary edema. No evidence of mass or definitive cancer findings. We will send a troponin as a precaution eminence is quite atypical discomfort.  Clinical Course   ____________________________________________   FINAL CLINICAL IMPRESSION(S) / ED DIAGNOSES  Final diagnoses:  Chest pain      This chart was dictated using voice recognition software.  Despite best efforts to proofread,  errors can occur which can change meaning.      Jeanmarie Plant, MD 05/15/16 1744    Jeanmarie Plant, MD 05/15/16 1744    Jeanmarie Plant, MD 05/15/16 [phone]

## 2016-05-15 NOTE — ED Triage Notes (Signed)
Pt c/o right lateral/posterior rib pain  With a cough for the past 4 months, worse in the past week with SOB.. Pt states he has a hx of pneumonia.

## 2016-05-16 ENCOUNTER — Encounter
Admission: EM | Disposition: A | Discharge status: Home / Self Care | Payer: Self-pay | Source: Home / Self Care | Attending: Emergency Medicine

## 2016-05-16 ENCOUNTER — Telehealth: Payer: Self-pay | Admitting: Cardiovascular Disease

## 2016-05-16 ENCOUNTER — Observation Stay (HOSPITAL_BASED_OUTPATIENT_CLINIC_OR_DEPARTMENT_OTHER)
Admit: 2016-05-16 | Discharge: 2016-05-16 | Disposition: A | Discharge status: Home / Self Care | Payer: Medicare Other | Attending: Specialist | Admitting: Specialist

## 2016-05-16 DIAGNOSIS — R7989 Other specified abnormal findings of blood chemistry: Secondary | ICD-10-CM | POA: Diagnosis not present

## 2016-05-16 DIAGNOSIS — J40 Bronchitis, not specified as acute or chronic: Secondary | ICD-10-CM

## 2016-05-16 DIAGNOSIS — J432 Centrilobular emphysema: Secondary | ICD-10-CM

## 2016-05-16 DIAGNOSIS — R059 Cough, unspecified: Secondary | ICD-10-CM

## 2016-05-16 DIAGNOSIS — R079 Chest pain, unspecified: Secondary | ICD-10-CM

## 2016-05-16 DIAGNOSIS — R0602 Shortness of breath: Secondary | ICD-10-CM

## 2016-05-16 DIAGNOSIS — R05 Cough: Secondary | ICD-10-CM

## 2016-05-16 DIAGNOSIS — R778 Other specified abnormalities of plasma proteins: Secondary | ICD-10-CM

## 2016-05-16 DIAGNOSIS — J810 Acute pulmonary edema: Secondary | ICD-10-CM

## 2016-05-16 DIAGNOSIS — M546 Pain in thoracic spine: Secondary | ICD-10-CM

## 2016-05-16 LAB — BASIC METABOLIC PANEL
ANION GAP: 4 — AB (ref 5–15)
BUN: 21 mg/dL — ABNORMAL HIGH (ref 6–20)
CALCIUM: 8.4 mg/dL — AB (ref 8.9–10.3)
CO2: 26 mmol/L (ref 22–32)
Chloride: 113 mmol/L — ABNORMAL HIGH (ref 101–111)
Creatinine, Ser: 1.11 mg/dL (ref 0.61–1.24)
GFR calc Af Amer: >60 mL/min (ref >60)
Glucose, Bld: 105 mg/dL — ABNORMAL HIGH (ref 65–99)
POTASSIUM: 4.3 mmol/L (ref 3.5–5.1)
SODIUM: 143 mmol/L (ref 135–145)

## 2016-05-16 LAB — CBC
HEMATOCRIT: 42.8 % (ref 40.0–52.0)
HEMOGLOBIN: 14.2 g/dL (ref 13.0–18.0)
MCH: 30.8 pg (ref 26.0–34.0)
MCHC: 33.2 g/dL (ref 32.0–36.0)
MCV: 92.7 fL (ref 80.0–100.0)
Platelets: 175 10*3/uL (ref 150–440)
RBC: 4.62 MIL/uL (ref 4.40–5.90)
RDW: 14 % (ref 11.5–14.5)
WBC: 7.4 10*3/uL (ref 3.8–10.6)

## 2016-05-16 LAB — ECHOCARDIOGRAM COMPLETE
HEIGHTINCHES: 69 in
WEIGHTICAEL: 3152 [oz_av]

## 2016-05-16 LAB — TROPONIN I: Troponin I: 0.1 ng/mL (ref <0.03)

## 2016-05-16 SURGERY — LEFT HEART CATH AND CORONARY ANGIOGRAPHY
Anesthesia: Moderate Sedation

## 2016-05-16 MED ORDER — TRAMADOL HCL 50 MG PO TABS: 100.0000 mg | ORAL_TABLET | Freq: Four times a day (QID) | ORAL | 0 refills | Status: DC | PRN

## 2016-05-16 MED ORDER — FUROSEMIDE 10 MG/ML IJ SOLN
20.0000 mg | Freq: Once | INTRAMUSCULAR | Status: AC
  Administered 2016-05-16: 20 mg via INTRAVENOUS
  Filled 2016-05-16: qty 2

## 2016-05-16 MED ORDER — SODIUM CHLORIDE 0.9 % IV SOLN: 250.0000 mL | INTRAVENOUS | Status: DC | PRN

## 2016-05-16 MED ORDER — AZITHROMYCIN 250 MG PO TABS: 250.0000 mg | ORAL_TABLET | Freq: Every day | ORAL | 0 refills | Status: DC

## 2016-05-16 MED ORDER — SODIUM CHLORIDE 0.9% FLUSH: 3.0000 mL | INTRAVENOUS | Status: DC | PRN

## 2016-05-16 MED ORDER — CYCLOBENZAPRINE HCL 5 MG PO TABS
7.5000 mg | ORAL_TABLET | Freq: Once | ORAL | Status: AC
  Administered 2016-05-16: 7.5 mg via ORAL
  Filled 2016-05-16: qty 1.5

## 2016-05-16 MED ORDER — SODIUM CHLORIDE 0.9 % WEIGHT BASED INFUSION: 3.0000 mL/kg/h | INTRAVENOUS | Status: DC

## 2016-05-16 MED ORDER — AZITHROMYCIN 250 MG PO TABS: 250.0000 mg | ORAL_TABLET | Freq: Every day | ORAL | Status: DC

## 2016-05-16 MED ORDER — ALBUTEROL SULFATE (2.5 MG/3ML) 0.083% IN NEBU
2.5000 mg | INHALATION_SOLUTION | RESPIRATORY_TRACT | Status: DC
Start: 2016-05-16 — End: 2016-05-16
  Administered 2016-05-16: 2.5 mg via RESPIRATORY_TRACT
  Filled 2016-05-16: qty 3

## 2016-05-16 MED ORDER — ASPIRIN 81 MG PO CHEW: 81.0000 mg | CHEWABLE_TABLET | ORAL | Status: DC

## 2016-05-16 MED ORDER — SODIUM CHLORIDE 0.9% FLUSH
3.0000 mL | Freq: Two times a day (BID) | INTRAVENOUS | Status: DC
  Administered 2016-05-16: 3 mL via INTRAVENOUS

## 2016-05-16 MED ORDER — SODIUM CHLORIDE 0.9 % WEIGHT BASED INFUSION: 1.0000 mL/kg/h | INTRAVENOUS | Status: DC

## 2016-05-16 MED ORDER — AZITHROMYCIN 250 MG PO TABS
500.0000 mg | ORAL_TABLET | Freq: Every day | ORAL | Status: AC
  Administered 2016-05-16: 500 mg via ORAL
  Filled 2016-05-16: qty 2

## 2016-05-16 NOTE — Progress Notes (Signed)
Pt is A&O ambulatory, wife at bedside, c/o chronic back pain, pt requested 100 Mg of Tramadol every 4 hrs, MD Willis made aware and Tramadol 100 mg every 6 hours PRN was given. Will continue to monitor.

## 2016-05-16 NOTE — Discharge Summary (Signed)
[facility] - [place] at [facility]   PATIENT NAME: [name]    MR#:  [id number]  DATE OF BIRTH:  [birth year]  DATE OF ADMISSION:  05/15/2016   ADMITTING PHYSICIAN: Houston Siren, MD  DATE OF DISCHARGE: 05/16/2016  1:39 PM  PRIMARY CARE PHYSICIAN: Ulanda Edison, MD   ADMISSION DIAGNOSIS:   Acute pulmonary edema (HCC) [J81.0] Elevated troponin [R79.89] Chest pain [R07.9]  DISCHARGE DIAGNOSIS:   Active Problems:   Chest pain   Acute pulmonary edema (HCC)   Elevated troponin   Centrilobular emphysema (HCC)   Bronchitis   Right-sided thoracic back pain   Short of breath on exertion   Cough   SECONDARY DIAGNOSIS:   Past Medical History:  Diagnosis Date  . Acute non-ST segment elevation myocardial infarction (HCC)   . Asthma   . Asthma   . CAD, multiple vessel   . COPD (chronic obstructive pulmonary disease) (HCC)   . GERD (gastroesophageal reflux disease)   . Hypertension   . Levoscoliosis    multi level degenrative disc disease  . Obstructive sleep apnea   . Tobacco abuse     HOSPITAL COURSE:   50 year old male with past medical history of coronary disease status post stent, hypertension, COPD, GERD, ongoing tobacco abuse who presents to the hospital due to social shortness of breath and noted to have an elevated troponin.  1. Shortness of breath/elevated troponin- secondary to bronchitis and demand ischemia - stable troponins, patient denies any chest pain. Felt better with a nebs and zpack - appreciate cardiology consult - outpatient follow up for cath or stress test recommended - ECHO done and results pending - patient feels great and wants to go home today - CT chest with no PE, mild pulm edema noted  2. CAD s/p CABG- stable, outpatient follow up recommended Cont cardiac meds with asa, metoprolol and statin  3. Essential hypertension-continue metoprolol, lisinopril.  4. Chronic musculoskeletal pain- continue outpatient  meds  5. BPH-continue Flomax.  6. Depression-continue Cymbalta.  Stable and being discharged home  DISCHARGE CONDITIONS:   Stable  CONSULTS OBTAINED:   Treatment Team:  Antonieta Iba, MD Iran Ouch, MD  DRUG ALLERGIES:   Allergies  Allergen Reactions  . Codeine Nausea And Vomiting   DISCHARGE MEDICATIONS:     Medication List    STOP taking these medications   clopidogrel 75 MG tablet Commonly known as:  PLAVIX   OXYCODONE HCL PO     TAKE these medications   albuterol 108 (90 Base) MCG/ACT inhaler Commonly known as:  PROVENTIL HFA;VENTOLIN HFA Inhale 2 puffs into the lungs 2 (two) times daily.   aspirin 81 MG tablet Take 81 mg by mouth daily.   azithromycin 250 MG tablet Commonly known as:  ZITHROMAX Take 1 tablet (250 mg total) by mouth daily. X 4 more days   diazepam 5 MG tablet Commonly known as:  VALIUM Take 5 mg by mouth 2 (two) times daily.   docusate sodium 100 MG capsule Commonly known as:  COLACE Take 100 mg by mouth 2 (two) times daily.   DULoxetine 30 MG capsule Commonly known as:  CYMBALTA Take 30-60 mg by mouth 2 (two) times daily. Take 30mg  in morning and take 60mg  at night   esomeprazole 40 MG capsule Commonly known as:  NEXIUM Take 1 capsule (40 mg total) by mouth daily before breakfast.   levalbuterol 45 MCG/ACT inhaler Commonly known as:  XOPENEX HFA Inhale 1-2 puffs into the lungs  every 4 (four) hours as needed.   lisinopril 2.5 MG tablet Commonly known as:  PRINIVIL,ZESTRIL Take 2.5 mg by mouth every evening.   methocarbamol 750 MG tablet Commonly known as:  ROBAXIN Take 750 mg by mouth 3 (three) times daily.   metoprolol tartrate 25 MG tablet Commonly known as:  LOPRESSOR TAKE 1 TABLET BY MOUTH TWICE DAILY   NON FORMULARY Stool softner takes 1 tablet daily.   ondansetron 4 MG tablet Commonly known as:  ZOFRAN Take 4 mg by mouth as needed for nausea.   pregabalin 75 MG capsule Commonly known as:   LYRICA Take 75 mg by mouth 2 (two) times daily.   rosuvastatin 20 MG tablet Commonly known as:  CRESTOR Take 20 mg by mouth every evening.   tamsulosin 0.4 MG Caps capsule Commonly known as:  FLOMAX Take 0.4 mg by mouth daily.   topiramate 25 MG tablet Commonly known as:  TOPAMAX Take 50 mg by mouth every evening.   traMADol 50 MG tablet Commonly known as:  ULTRAM Take 2 tablets (100 mg total) by mouth every 6 (six) hours as needed for severe pain.        DISCHARGE INSTRUCTIONS:   1. PCP f/u in 1-2 weeks 2. F/u with Cardiology in 2 weeks  DIET:   Cardiac diet  ACTIVITY:   Activity as tolerated  OXYGEN:   Home Oxygen: No.  Oxygen Delivery: room air  DISCHARGE LOCATION:   home   If you experience worsening of your admission symptoms, develop shortness of breath, life threatening emergency, suicidal or homicidal thoughts you must seek medical attention immediately by calling 911 or calling your MD immediately  if symptoms less severe.  You Must read complete instructions/literature along with all the possible adverse reactions/side effects for all the Medicines you take and that have been prescribed to you. Take any new Medicines after you have completely understood and accpet all the possible adverse reactions/side effects.   Please note  You were cared for by a hospitalist during your hospital stay. If you have any questions about your discharge medications or the care you received while you were in the hospital after you are discharged, you can call the unit and asked to speak with the hospitalist on call if the hospitalist that took care of you is not available. Once you are discharged, your primary care physician will handle any further medical issues. Please note that NO REFILLS for any discharge medications will be authorized once you are discharged, as it is imperative that you return to your primary care physician (or establish a relationship with a primary  care physician if you do not have one) for your aftercare needs so that they can reassess your need for medications and monitor your lab values.    On the day of Discharge:  VITAL SIGNS:   Blood pressure (!) 104/59, pulse 63, temperature 97.6 F (36.4 C), resp. rate 17, height 5\' 9"  (1.753 m), weight 90.2 kg (198 lb 12.8 oz), SpO2 92 %.  PHYSICAL EXAMINATION:    GENERAL:  50 y.o.-year-old patient lying in the bed with no acute distress.  EYES: Pupils equal, round, reactive to light and accommodation. No scleral icterus. Extraocular muscles intact.  HEENT: Head atraumatic, normocephalic. Oropharynx and nasopharynx clear.  NECK:  Supple, no jugular venous distention. No thyroid enlargement, no tenderness.  LUNGS: Normal breath sounds bilaterally, no wheezing, rales,rhonchi or crepitation. No use of accessory muscles of respiration. Very fine right basilar crackles CARDIOVASCULAR: S1,  S2 normal. No murmurs, rubs, or gallops.  ABDOMEN: Soft, non-tender, non-distended. Bowel sounds present. No organomegaly or mass.  EXTREMITIES: No pedal edema, cyanosis, or clubbing.  NEUROLOGIC: Cranial nerves II through XII are intact. Muscle strength 5/5 in all extremities. Sensation intact. Gait not checked.  PSYCHIATRIC: The patient is alert and oriented x 3.  SKIN: No obvious rash, lesion, or ulcer.   DATA REVIEW:   CBC  Recent Labs Lab 05/16/16 0557  WBC 7.4  HGB 14.2  HCT 42.8  PLT 175    Chemistries   Recent Labs Lab 05/15/16 1529 05/16/16 0557  NA 141 143  K 4.0 4.3  CL 109 113*  CO2 23 26  GLUCOSE 111* 105*  BUN 21* 21*  CREATININE 1.24 1.11  CALCIUM 8.9 8.4*  AST 19  --   ALT 14*  --   ALKPHOS 69  --   BILITOT 0.5  --      Microbiology Results  Results for orders placed or performed during the hospital encounter of 04/28/11  Surgical pcr screen     Status: None   Collection Time: 05/01/11  4:19 AM  Result Value Ref Range Status   MRSA, PCR NEGATIVE NEGATIVE Final    Staphylococcus aureus NEGATIVE NEGATIVE Final    Comment:        The Xpert SA Assay (FDA approved for NASAL specimens only), is one component of a comprehensive surveillance program.  It is not intended to diagnose infection nor to guide or monitor treatment.    RADIOLOGY:  Dg Chest 2 View  Result Date: 05/15/2016 CLINICAL DATA:  Right lateral/ posterior rib pain. Cough for 4 months. No acute injury. EXAM: CHEST  2 VIEW COMPARISON:  Radiographs 05/25/2011 and 05/04/2011. FINDINGS: The heart size and mediastinal contours are stable status post median sternotomy and CABG. There is mildly increased interstitial prominence. The pulmonary vascularity appears normal. There is no confluent airspace opacity, pleural effusion or pneumothorax. No acute osseous findings are seen. IMPRESSION: Mildly increased interstitial prominence compared with previous studies from 2012, possibly technical. The pulmonary vascularity appears normal. No definite acute findings. Electronically Signed   By: Carey BullocksWilliam  Veazey M.D.   On: 05/15/2016 16:49   Ct Angio Chest Pe W Or Wo Contrast  Result Date: 05/15/2016 CLINICAL DATA:  Right lateral chest pain for 3 days. History of prior CABG. History of smoking. EXAM: CT ANGIOGRAPHY CHEST WITH CONTRAST TECHNIQUE: Multidetector CT imaging of the chest was performed using the standard protocol during bolus administration of intravenous contrast. Multiplanar CT image reconstructions and MIPs were obtained to evaluate the vascular anatomy. CONTRAST:  75 cc Isovue 370 intravenously. COMPARISON:  Chest radiograph 05/15/2016 FINDINGS: Cardiovascular: Satisfactory opacification of the pulmonary arteries to the segmental level. No evidence of pulmonary embolism. Normal heart size. No pericardial effusion. Postsurgical changes from prior CABG and coronary arterial stenting. Great vessels are normal in caliber. Mediastinum/Nodes: No enlarged mediastinal, hilar, or axillary lymph nodes.  Thyroid gland, trachea, and esophagus demonstrate no significant findings. There are numerous shotty mediastinal and bilateral hilar lymph nodes, sub pathologic by size criteria. Lungs/Pleura: Mild interstitial pulmonary edema without evidence of pleural effusion. There is an element of mosaic profusion, suggestive of superimposed small airway disease. Upper Abdomen: No acute abnormality. Musculoskeletal: No chest wall abnormality. No acute or significant osseous findings. Review of the MIP images confirms the above findings. IMPRESSION: No evidence of pulmonary embolus. Number shotty mediastinal and bilateral hilar lymph nodes, nonspecific. Mild pulmonary edema. Evidence of small airway  disease. Electronically Signed   By: Ted Mcalpine M.D.   On: 05/15/2016 17:33     Management plans discussed with the patient, family and they are in agreement.  CODE STATUS:     Code Status Orders        Start     Ordered   05/15/16 2012  Full code  Continuous     05/15/16 2011    Code Status History    Date Active Date Inactive Code Status Order ID Comments User Context   05/15/2016  8:11 PM 05/16/2016  6:51 AM Full Code [id number]  Houston Siren, MD Inpatient      TOTAL TIME TAKING CARE OF THIS PATIENT: 37 minutes.    Enid Baas M.D on 05/16/2016 at 2:21 PM  Between 7am to 6pm - Pager - [phone]  After 6pm go to [email or web address] - Social research officer, government  [facility] [place] [facility]  Office  [phone]  CC: Primary care physician; Ulanda Edison, MD   Note: This dictation was prepared with Dragon dictation along with smaller phrase technology. Any transcriptional errors that result from this process are unintentional.

## 2016-05-16 NOTE — Telephone Encounter (Signed)
TCM.Marland KitchenMarland KitchenMarland KitchenPt was seen by Dr Mariah Milling and is coming 05/26/16 to see Ward Givens

## 2016-05-16 NOTE — Telephone Encounter (Signed)
Attempted to contact pt regarding discharge from [facility] on 05/16/16. Left message asking pt to call back regarding discharge instructions and/or medications. Advised pt of appt w/ Ward Givens, NP on 05/26/16 at 2:00 w/ [facility]. Asked pt to call back if unable to keep this appt.

## 2016-05-16 NOTE — Care Management Obs Status (Signed)
MEDICARE OBSERVATION STATUS NOTIFICATION   Patient Details  Name: [name]. MRN: [id number] Date of Birth: 1966/06/23   Medicare Observation Status Notification Given:  Yes    Eber Hong, RN 05/16/2016, 9:53 AM

## 2016-05-16 NOTE — Consult Note (Signed)
Cardiology Consultation Note  Patient ID: Dalton ShoreJames M Oyen Jr., MRN: [id number], DOB/AGE: 25-May-1966 50 y.o. Admit date: 05/15/2016   Date of Consult: 05/16/2016 Primary Physician: Dalton EdisonWILLIAMS,EMMA, MD Primary Cardiologist: [facility] Requesting Physician: Dr. Cherlynn KaiserSainani, MD  Chief Complaint: Back and chest pain Reason for Consult: Chest pain/elevated troponin   HPI: 50 y.o. male with h/o CAD s/p 3 vessel CABG (LIMA-LAD, SVG-Diag, SVG OM) in 2012 in the setting of an MI s/p PCI/DES to mid LCx in 2015, HTN, HLD, on going tobacco abuse, and chronic back pain who  [name] 3-4 day history of chest and back pain.   [name], [name]. [facility] in 2012 with chest pain at rest, was found to have a myocardial infarction, underwent cardiac catheterization which revealed multi-vessel disease, and was [name] rev[facility]. He underwent 3-v CABG (by [name]), had no post-operative complications, and has followed up with his cardiologist (Dr. Dossie Arbourim [name] at [name]), lat in 2014. 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 [name], was found to have a 883-month history of chest pain on exertion and at rest, and was referred for cardiac cath. His coronary angiography revealed a patent LIMA to LAD, a patent SVG to diagonal, an occluded SVG to OM, a mid LCx 99% subtotal occlusion, and a moderate-caliber rPL subtotal occlusion. He underwent PCI with DES to the mid LCx subtotal occlusion and the rPL subtotal occlusion. [name] reported feeling well with a 6 month history of bilateral LE weakness.   [name] with a 3-4 day history of increased cough, wheezing, back pain and chest pain. Pain was worse with  deep inspiration and with cough. There was some associated diaphoresis, nausea, and increased SOB. There has been a noticeable decline in his functional capacity over the last 6 months or so per his report. He has not had any exertional chest pain or SOB. Upon the [facility]'s arrival to [name] they were found to have troponin 0.13-->0.14-->0.10. K+ 4.0-->4.3, SCr 1.24-->1.11, Unremarkable CBC. ECG as below, CXR showed mildly increased interstitial prominence. CTA chest was negative for PE. He has been chest pain free since arriving to the ED.    Past Medical History:  Diagnosis Date  . Acute non-ST segment elevation myocardial infarction (HCC)   . Asthma   . Asthma   . CAD, multiple vessel   . COPD (chronic obstructive pulmonary disease) (HCC)   . GERD (gastroesophageal reflux disease)   . Hypertension   . Levoscoliosis    multi level degenrative disc disease  . Obstructive sleep apnea   . Tobacco abuse       Most Recent Cardiac Studies: Cardiac cath 07/10/2014: SUMMARY FINDINGS: Successful PCI 1. Coronaryartery diseaseas detailed aboveincluding: 99% subtotalocclusion midLCx, CTO of superiorbranch of OM, subtotalocclusion of theinferior branchof the OM, 30%mid RCA stenosis,, subtotal occlusionof a mediumcaliber PL branchand occludedSVG-OM graft 2. Grafts:LIMA-to-LAD:widely patent; SVG-to-Diag:patent;SVG- to-OM:occluded at theostial anastomosis(based on the patient'sreport, he had4 vessel bypass,it could be a jump graftfor the OM branches). 3. SuccessfulPCI to themidLCx/OM subtotalocclusion with placementof a Promus Premier2.5 x 16 mmDES resulted 0% residualstenosis and TIMI3 flow. 4. SuccessfulPCI to theright PL branchwith placement ofa PromusPremier 2.25 x 32mm DES  resulted0% residual stenosis and TIMI3 flow. 5. Normalleft heart  fillingpressure: LVEDP= 13 mm Hg 6. NormalLV contractilefunction on LVgram (EF>55%)   Surgical History:  Past Surgical History:  Procedure Laterality Date  . BACK SURGERY     Degenerative Scoliosis T10-S1   . CARDIAC CATHETERIZATION  2013   right leg  . CORONARY ARTERY BYPASS GRAFT  05/01/2011   x 4  . KNEE SURGERY     right knee     Home Meds: Prior to Admission medications   Medication Sig Start Date End Date Taking? Authorizing Provider  albuterol (PROVENTIL HFA;VENTOLIN HFA) 108 (90 Base) MCG/ACT inhaler Inhale 2 puffs into the lungs 2 (two) times daily.   Yes Historical Provider, MD  aspirin 81 MG tablet Take 81 mg by mouth daily.   Yes Historical Provider, MD  docusate sodium (COLACE) 100 MG capsule Take 100 mg by mouth 2 (two) times daily.   Yes Historical Provider, MD  DULoxetine (CYMBALTA) 30 MG capsule Take 30-60 mg by mouth 2 (two) times daily. Take 30mg  in morning and take 60mg  at night   Yes Historical Provider, MD  esomeprazole (NEXIUM) 40 MG capsule Take 1 capsule (40 mg total) by mouth daily before breakfast. 06/18/13  Yes Antonieta Iba, MD  lisinopril (PRINIVIL,ZESTRIL) 2.5 MG tablet Take 2.5 mg by mouth every evening.   Yes Historical Provider, MD  metoprolol tartrate (LOPRESSOR) 25 MG tablet TAKE 1 TABLET BY MOUTH TWICE DAILY 06/18/13  Yes Antonieta Iba, MD  ondansetron (ZOFRAN) 4 MG tablet Take 4 mg by mouth as needed for nausea.   Yes Historical Provider, MD  rosuvastatin (CRESTOR) 20 MG tablet Take 20 mg by mouth every evening.   Yes Historical Provider, MD  tamsulosin (FLOMAX) 0.4 MG CAPS capsule Take 0.4 mg by mouth daily.   Yes Historical Provider, MD  topiramate (TOPAMAX) 25 MG tablet Take 50 mg by mouth every evening.   Yes Historical Provider, MD  atorvastatin (LIPITOR) 80 MG tablet Take 1 tablet (80 mg total) by mouth daily. 12/16/12 12/16/13  Antonieta Iba, MD  atorvastatin (LIPITOR) 80 MG tablet TAKE 1 TABLET BY MOUTH EVERY  DAY Patient not taking: Reported on 05/15/2016 01/08/14   Antonieta Iba, MD  clopidogrel (PLAVIX) 75 MG tablet Take 1 tablet (75 mg total) by mouth daily. Patient not taking: Reported on 05/15/2016 06/18/13   Antonieta Iba, MD  diazepam (VALIUM) 5 MG tablet Take 5 mg by mouth 2 (two) times daily.    Historical Provider, MD  levalbuterol [facility] HFA) 45 MCG/ACT inhaler Inhale 1-2 puffs into the lungs every 4 (four) hours as needed. Patient not taking: Reported on 05/15/2016 10/31/11   Antonieta Iba, MD  methocarbamol (ROBAXIN) 750 MG tablet Take 750 mg by mouth 3 (three) times daily.    Historical Provider, MD  NON FORMULARY Stool softner takes 1 tablet daily.    Historical Provider, MD  OXYCODONE HCL PO Take by mouth every 6 (six) hours as needed.    Historical Provider, MD  pregabalin (LYRICA) 75 MG capsule Take 75 mg by mouth 2 (two) times daily.    Historical Provider, MD    Inpatient Medications:  . albuterol  2.5 mg Inhalation BID  . aspirin EC  81 mg Oral Daily  . docusate sodium  100 mg Oral BID  . DULoxetine  30 mg Oral q morning - 10a  . DULoxetine  60 mg Oral QHS  . enoxaparin (LOVENOX) injection  40 mg Subcutaneous  Q24H  . Influenza vac split quadrivalent PF  0.5 mL Intramuscular Tomorrow-1000  . pantoprazole  40 mg Oral Daily  . rosuvastatin  20 mg Oral QPM  . sodium chloride flush  3 mL Intravenous Q12H  . tamsulosin  0.4 mg Oral Daily  . topiramate  50 mg Oral QPM      Allergies:  Allergies  Allergen Reactions  . Codeine Nausea And Vomiting    Social History   Social History  . Marital status: Married    Spouse name: N/A  . Number of children: N/A  . Years of education: N/A   Occupational History  . Not on file.   Social History Main Topics  . Smoking status: Former Smoker    Packs/day: 1.50    Years: 25.00    Types: Cigarettes    Quit date: 04/28/2011  . Smokeless tobacco: Never Used  . Alcohol use No  . Drug use: No  . Sexual activity: Not on  file   Other Topics Concern  . Not on file   Social History Narrative  . No narrative on file     Family History  Problem Relation Age of Onset  . Heart attack Mother      Review of Systems: Review of Systems  Constitutional: Positive for diaphoresis and malaise/fatigue. Negative for chills, fever and weight loss.  HENT: Negative for congestion.   Eyes: Negative for discharge and redness.  Respiratory: Positive for cough, sputum production, shortness of breath and wheezing. Negative for hemoptysis.   Cardiovascular: Positive for chest pain. Negative for palpitations, orthopnea, claudication, leg swelling and PND.  Gastrointestinal: Positive for nausea. Negative for abdominal pain, blood in stool, heartburn, melena and vomiting.  Genitourinary: Negative for hematuria.  Musculoskeletal: Positive for back pain and myalgias. Negative for falls.  Skin: Negative for rash.  Neurological: Positive for weakness. Negative for dizziness, tingling, tremors, sensory change, speech change, focal weakness and loss of consciousness.  Endo/Heme/Allergies: Does not bruise/bleed easily.  Psychiatric/Behavioral: Negative for substance abuse. The patient is not nervous/anxious.   All other systems reviewed and are negative.   Labs:  Recent Labs  05/15/16 1529 05/15/16 1912 05/15/16 2322  TROPONINI 0.13* 0.14* 0.10*   Lab Results  Component Value Date   WBC 7.4 05/16/2016   HGB 14.2 05/16/2016   HCT 42.8 05/16/2016   MCV 92.7 05/16/2016   PLT 175 05/16/2016    Recent Labs Lab 05/15/16 1529 05/16/16 0557  NA 141 143  K 4.0 4.3  CL 109 113*  CO2 23 26  BUN 21* 21*  CREATININE 1.24 1.11  CALCIUM 8.9 8.4*  PROT 7.1  --   BILITOT 0.5  --   ALKPHOS 69  --   ALT 14*  --   AST 19  --   GLUCOSE 111* 105*   Lab Results  Component Value Date   CHOL 164 08/02/2011   HDL 37 (L) 08/02/2011   LDLCALC 99 08/02/2011   TRIG 140 08/02/2011   No results found for:  DDIMER  Radiology/Studies:  Dg Chest 2 View  Result Date: 05/15/2016 CLINICAL DATA:  Right lateral/ posterior rib pain. Cough for 4 months. No acute injury. EXAM: CHEST  2 VIEW COMPARISON:  Radiographs 05/25/2011 and 05/04/2011. FINDINGS: The heart size and mediastinal contours are stable status post median sternotomy and CABG. There is mildly increased interstitial prominence. The pulmonary vascularity appears normal. There is no confluent airspace opacity, pleural effusion or pneumothorax. No acute osseous findings are seen. IMPRESSION:  Mildly increased interstitial prominence compared with previous studies from 2012, possibly technical. The pulmonary vascularity appears normal. No definite acute findings. Electronically Signed   By: Carey Bullocks M.D.   On: 05/15/2016 16:49   Ct Angio Chest Pe W Or Wo Contrast  Result Date: 05/15/2016 CLINICAL DATA:  Right lateral chest pain for 3 days. History of prior CABG. History of smoking. EXAM: CT ANGIOGRAPHY CHEST WITH CONTRAST TECHNIQUE: Multidetector CT imaging of the chest was performed using the standard protocol during bolus administration of intravenous contrast. Multiplanar CT image reconstructions and MIPs were obtained to evaluate the vascular anatomy. CONTRAST:  75 cc Isovue 370 intravenously. COMPARISON:  Chest radiograph 05/15/2016 FINDINGS: Cardiovascular: Satisfactory opacification of the pulmonary arteries to the segmental level. No evidence of pulmonary embolism. Normal heart size. No pericardial effusion. Postsurgical changes from prior CABG and coronary arterial stenting. Great vessels are normal in caliber. Mediastinum/Nodes: No enlarged mediastinal, hilar, or axillary lymph nodes. Thyroid gland, trachea, and esophagus demonstrate no significant findings. There are numerous shotty mediastinal and bilateral hilar lymph nodes, sub pathologic by size criteria. Lungs/Pleura: Mild interstitial pulmonary edema without evidence of pleural effusion.  There is an element of mosaic profusion, suggestive of superimposed small airway disease. Upper Abdomen: No acute abnormality. Musculoskeletal: No chest wall abnormality. No acute or significant osseous findings. Review of the MIP images confirms the above findings. IMPRESSION: No evidence of pulmonary embolus. Number shotty mediastinal and bilateral hilar lymph nodes, nonspecific. Mild pulmonary edema. Evidence of small airway disease. Electronically Signed   By: Ted Mcalpine M.D.   On: 05/15/2016 17:33    EKG: Interpreted by me showed: NSR, 71 bpm, short PR interval, nonspecific anterolateral st/t changes Telemetry: Interpreted by me showed: NSR, 60's bpm  Weights: Filed Weights   05/15/16 1526 05/15/16 2012  Weight: 197 lb (89.4 kg) 197 lb (89.4 kg)     Physical Exam: Blood pressure (!) 95/56, pulse 67, temperature 98.1 F (36.7 C), temperature source Oral, resp. rate 16, height 5\' 9"  (1.753 m), weight 197 lb (89.4 kg), SpO2 94 %. Body mass index is 29.09 kg/m. General: Well developed, well nourished, in no acute distress. Head: Normocephalic, atraumatic, sclera non-icteric, no xanthomas, nares are without discharge.  Neck: Negative for carotid bruits. JVD not elevated. Lungs: Diffuse bilateral wheezing. Breathing is unlabored. Heart: RRR with S1 S2. No murmurs, rubs, or gallops appreciated. Abdomen: Soft, non-tender, non-distended with normoactive bowel sounds. No hepatomegaly. No rebound/guarding. No obvious abdominal masses. Msk:  Strength and tone appear normal for age. Extremities: No clubbing or cyanosis. No edema. Distal pedal pulses are 2+ and equal bilaterally. Neuro: Alert and oriented X 3. No facial asymmetry. No focal deficit. Moves all extremities spontaneously. Psych:  Responds to questions appropriately with a normal affect.    Assessment and Plan:  Active Problems:   Chest pain    1. Atypical chest pain/elevated troponin: -Appears to be in the setting of  bronchitis/COPD exacerbation  -Elevated troponin mild and flat trending possibly in the setting of supply demand ischemia 2/2 COPD exacerbation -Consider outpatient nuclear stress testing -Currently without chest pain -Patient wants to go home at this time -Continue current medical therapy with ASA, beta blocker -Lifestyle changes (smoking cessation, diet, exercise)  2. COPD exacerbation: -Symptoms improved with nebulizer overnight -Steroids  -ABX per IM -Smoking cessation advised  3. HTN: -BP soft this morning -Hold BB and ACEi at this time -Resume as tolerated  4. HLD: -Crestor -Fasting lipids pending     Signed,  Eula Listen, PA-C [facility] Pager: [phone] 05/16/2016, 7:38 AM

## 2016-05-16 NOTE — Discharge Instructions (Signed)
Pleurisy  Pleurisy is an inflammation and swelling of the lining of the lungs (pleura). Because of this inflammation, it hurts to breathe. It can be aggravated by coughing, laughing, or deep breathing. Pleurisy is often caused by an underlying infection or disease.   HOME CARE INSTRUCTIONS   Monitor your pleurisy for any changes. The following actions may help to alleviate any discomfort you are experiencing:  · Medicine may help with pain. Only take over-the-counter or prescription medicines for pain, discomfort, or fever as directed by your health care provider.  · Only take antibiotic medicine as directed. Make sure to finish it even if you start to feel better.  SEEK MEDICAL CARE IF:   · Your pain is not controlled with medicine or is increasing.  · You have an increase in pus-like (purulent) secretions brought up with coughing.  SEEK IMMEDIATE MEDICAL CARE IF:   · You have blue or dark lips, fingernails, or toenails.  · You are coughing up blood.  · You have increased difficulty breathing.  · You have continuing pain unrelieved by medicine or pain lasting more than 1 week.  · You have pain that radiates into your neck, arms, or jaw.  · You develop increased shortness of breath or wheezing.  · You develop a fever, rash, vomiting, fainting, or other serious symptoms.  MAKE SURE YOU:  · Understand these instructions.    · Will watch your condition.    · Will get help right away if you are not doing well or get worse.        This information is not intended to replace advice given to you by your health care provider. Make sure you discuss any questions you have with your health care provider.     Document Released: 08/07/2005 Document Revised: 04/09/2013 Document Reviewed: 01/19/2013  Elsevier Interactive Patient Education ©2016 [employer]

## 2016-05-16 NOTE — Progress Notes (Signed)
Nutrition Brief Note  Patient identified on the Malnutrition Screening Tool (MST) Report  Wt Readings from Last 15 Encounters:  05/16/16 198 lb 12.8 oz (90.2 kg)  06/18/13 209 lb (94.8 kg)  12/16/12 191 lb 8 oz (86.9 kg)  01/29/12 185 lb 4 oz (84 kg)  08/24/11 187 lb (84.8 kg)  08/02/11 187 lb 12 oz (85.2 kg)  05/25/11 178 lb (80.7 kg)  05/18/11 171 lb 12 oz (77.9 kg)    Body mass index is 29.36 kg/m. Patient meets criteria for overweight based on current BMI.   Current diet order is regular, patient is consuming approximately 100% of meals at this time. Labs and medications reviewed.   No nutrition interventions warranted at this time. If nutrition issues arise, please consult RD.   Dalton Ano. Dalton Sliwa, MS, RD LDN Inpatient Clinical Dietitian Pager [phone]

## 2016-05-16 NOTE — Progress Notes (Signed)
  Echocardiogram 2D Echocardiogram has been performed.  [name] M 05/16/2016, 9:02 AM

## 2016-05-17 LAB — HEMOGLOBIN A1C
HEMOGLOBIN A1C: 5.8 % — AB (ref 4.8–5.6)
Mean Plasma Glucose: 120 mg/dL

## 2016-05-26 ENCOUNTER — Encounter: Payer: Medicare Other | Admitting: Nurse Practitioner

## 2016-10-11 ENCOUNTER — Other Ambulatory Visit: Payer: Self-pay | Admitting: Gastroenterology

## 2016-10-11 DIAGNOSIS — R1310 Dysphagia, unspecified: Secondary | ICD-10-CM

## 2016-10-19 ENCOUNTER — Ambulatory Visit: Payer: Medicare Other | Attending: Gastroenterology

## 2016-11-27 ENCOUNTER — Emergency Department: Payer: Medicare Other

## 2016-11-27 ENCOUNTER — Emergency Department
Admission: EM | Admit: 2016-11-27 | Discharge: 2016-11-27 | Disposition: A | Discharge status: Home / Self Care | Payer: Medicare Other | Attending: Emergency Medicine | Admitting: Emergency Medicine

## 2016-11-27 ENCOUNTER — Encounter: Payer: Self-pay | Admitting: Emergency Medicine

## 2016-11-27 DIAGNOSIS — I252 Old myocardial infarction: Secondary | ICD-10-CM | POA: Diagnosis not present

## 2016-11-27 DIAGNOSIS — S9002XA Contusion of left ankle, initial encounter: Secondary | ICD-10-CM | POA: Diagnosis not present

## 2016-11-27 DIAGNOSIS — S161XXA Strain of muscle, fascia and tendon at neck level, initial encounter: Secondary | ICD-10-CM | POA: Diagnosis not present

## 2016-11-27 DIAGNOSIS — M545 Low back pain: Secondary | ICD-10-CM | POA: Diagnosis not present

## 2016-11-27 DIAGNOSIS — I25810 Atherosclerosis of coronary artery bypass graft(s) without angina pectoris: Secondary | ICD-10-CM | POA: Diagnosis not present

## 2016-11-27 DIAGNOSIS — Y999 Unspecified external cause status: Secondary | ICD-10-CM | POA: Insufficient documentation

## 2016-11-27 DIAGNOSIS — Z7982 Long term (current) use of aspirin: Secondary | ICD-10-CM | POA: Insufficient documentation

## 2016-11-27 DIAGNOSIS — Z87891 Personal history of nicotine dependence: Secondary | ICD-10-CM | POA: Diagnosis not present

## 2016-11-27 DIAGNOSIS — Y92009 Unspecified place in unspecified non-institutional (private) residence as the place of occurrence of the external cause: Secondary | ICD-10-CM | POA: Diagnosis not present

## 2016-11-27 DIAGNOSIS — I10 Essential (primary) hypertension: Secondary | ICD-10-CM | POA: Insufficient documentation

## 2016-11-27 DIAGNOSIS — W108XXA Fall (on) (from) other stairs and steps, initial encounter: Secondary | ICD-10-CM | POA: Insufficient documentation

## 2016-11-27 DIAGNOSIS — Z79899 Other long term (current) drug therapy: Secondary | ICD-10-CM | POA: Insufficient documentation

## 2016-11-27 DIAGNOSIS — Y939 Activity, unspecified: Secondary | ICD-10-CM | POA: Insufficient documentation

## 2016-11-27 DIAGNOSIS — S199XXA Unspecified injury of neck, initial encounter: Secondary | ICD-10-CM | POA: Diagnosis present

## 2016-11-27 DIAGNOSIS — S5001XA Contusion of right elbow, initial encounter: Secondary | ICD-10-CM | POA: Diagnosis not present

## 2016-11-27 DIAGNOSIS — W19XXXA Unspecified fall, initial encounter: Secondary | ICD-10-CM

## 2016-11-27 DIAGNOSIS — M5442 Lumbago with sciatica, left side: Secondary | ICD-10-CM

## 2016-11-27 MED ORDER — METHOCARBAMOL 500 MG PO TABS: 500.0000 mg | ORAL_TABLET | Freq: Four times a day (QID) | ORAL | 0 refills | Status: DC

## 2016-11-27 MED ORDER — HYDROCODONE-ACETAMINOPHEN 5-325 MG PO TABS: 1.0000 | ORAL_TABLET | ORAL | 0 refills | Status: DC | PRN

## 2016-11-27 MED ORDER — ONDANSETRON 8 MG PO TBDP
8.0000 mg | ORAL_TABLET | Freq: Once | ORAL | Status: AC
  Administered 2016-11-27: 8 mg via ORAL
  Filled 2016-11-27: qty 1

## 2016-11-27 MED ORDER — MELOXICAM 15 MG PO TABS: 15.0000 mg | ORAL_TABLET | Freq: Every day | ORAL | 0 refills | Status: DC

## 2016-11-27 MED ORDER — ONDANSETRON 4 MG PO TBDP: 4.0000 mg | ORAL_TABLET | Freq: Three times a day (TID) | ORAL | 0 refills | Status: DC | PRN

## 2016-11-27 MED ORDER — OXYCODONE-ACETAMINOPHEN 5-325 MG PO TABS
1.0000 | ORAL_TABLET | Freq: Once | ORAL | Status: AC
  Administered 2016-11-27: 1 via ORAL
  Filled 2016-11-27: qty 1

## 2016-11-27 NOTE — ED Provider Notes (Signed)
[facility] Emergency Department Provider Note  ____________________________________________  Time seen: Approximately 7:18 PM  I have reviewed the triage vital signs and the nursing notes.   HISTORY  Chief Complaint Fall    HPI [name]. is a 51 y.o. male who presents to emergency department complaining of neck, lower back, right elbow, left ankle pain. Patient states it is coming down 5 stairs at home when his left knee "crumpled" under him causing him to fall down 4-5 stairs. Patient denies hitting his head or losing consciousness. He is now complaining of sharp midline neck and lower back pain. He reports that he is also expressing pain to the elbow and left ankle but it is not as severe as his neck and back. Patient denies any headache, visual changes, numbness and tingling in any extremity. he reports that he has had a lumbar fusionto the L3-S1 region with an place hardware. No saddle anesthesia, paresthesias, bowel or bladder dysfunction. No medications prior to arrival. Patient was ambulatory after fall.   Past Medical History:  Diagnosis Date  . Acute non-ST segment elevation myocardial infarction (HCC)   . CAD, multiple vessel   . COPD (chronic obstructive pulmonary disease) (HCC)   . GERD (gastroesophageal reflux disease)   . Hypertension   . Levoscoliosis    multi level degenrative disc disease  . Obstructive sleep apnea   . Tobacco abuse     Patient Active Problem List   Diagnosis Date Noted  . Acute pulmonary edema (HCC)   . Elevated troponin   . Centrilobular emphysema (HCC)   . Bronchitis   . Right-sided thoracic back pain   . Short of breath on exertion   . Cough   . Chest pain 05/15/2016  . Previous back surgery 06/18/2013  . DJD (degenerative joint disease) 12/16/2012  . Nausea 08/02/2011  . 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 Smoking hx 05/18/2011  . Hyperlipidemia LDL goal <70 05/18/2011    Past Surgical History:   Procedure Laterality Date  . BACK SURGERY     Degenerative Scoliosis T10-S1   . CARDIAC CATHETERIZATION  2013   right leg  . CORONARY ARTERY BYPASS GRAFT  05/01/2011   x 4  . KNEE SURGERY     right knee    Prior to Admission medications   Medication Sig Start Date End Date Taking? Authorizing Provider  albuterol (PROVENTIL HFA;VENTOLIN HFA) 108 (90 Base) MCG/ACT inhaler Inhale 2 puffs into the lungs 2 (two) times daily.    Historical Provider, MD  aspirin 81 MG tablet Take 81 mg by mouth daily.    Historical Provider, MD  diazepam (VALIUM) 5 MG tablet Take 5 mg by mouth 2 (two) times daily.    Historical Provider, MD  docusate sodium (COLACE) 100 MG capsule Take 100 mg by mouth 2 (two) times daily.    Historical Provider, MD  DULoxetine (CYMBALTA) 30 MG capsule Take 30-60 mg by mouth 2 (two) times daily. Take 30mg  in morning and take 60mg  at night    Historical Provider, MD  esomeprazole (NEXIUM) 40 MG capsule Take 1 capsule (40 mg total) by mouth daily before breakfast. 06/18/13   , MD  HYDROcodone-acetaminophen (NORCO/VICODIN) 5-325 MG tablet Take 1 tablet by mouth every 4 (four) hours as needed for moderate pain. 11/27/16   Antonieta Iba Hawraa Stambaugh, PA-C  levalbuterol (XOPENEX HFA) 45 MCG/ACT inhaler Inhale 1-2 puffs into the lungs every 4 (four) hours as needed. Patient not taking:  Reported on 05/15/2016 10/31/11   Antonieta Iba, MD  lisinopril (PRINIVIL,ZESTRIL) 2.5 MG tablet Take 2.5 mg by mouth every evening.    Historical Provider, MD  meloxicam (MOBIC) 15 MG tablet Take 1 tablet (15 mg total) by mouth daily. 11/27/16   Delorise Royals Orvel Cutsforth, PA-C  methocarbamol (ROBAXIN) 500 MG tablet Take 1 tablet (500 mg total) by mouth 4 (four) times daily. 11/27/16   Delorise Royals Sunny Gains, PA-C  metoprolol tartrate (LOPRESSOR) 25 MG tablet TAKE 1 TABLET BY MOUTH TWICE DAILY 06/18/13   Antonieta Iba, MD  NON FORMULARY Stool softner takes 1 tablet daily.    Historical Provider, MD   ondansetron (ZOFRAN) 4 MG tablet Take 4 mg by mouth as needed for nausea.    Historical Provider, MD  ondansetron (ZOFRAN-ODT) 4 MG disintegrating tablet Take 1 tablet (4 mg total) by mouth every 8 (eight) hours as needed for nausea or vomiting. 11/27/16   Delorise Royals Donley Harland, PA-C  rosuvastatin (CRESTOR) 20 MG tablet Take 20 mg by mouth every evening.    Historical Provider, MD  tamsulosin (FLOMAX) 0.4 MG CAPS capsule Take 0.4 mg by mouth daily.    Historical Provider, MD  topiramate (TOPAMAX) 25 MG tablet Take 50 mg by mouth every evening.    Historical Provider, MD  traMADol (ULTRAM) 50 MG tablet Take 2 tablets (100 mg total) by mouth every 6 (six) hours as needed for severe pain. 05/16/16   Enid Baas, MD    Allergies Codeine  Family History  Problem Relation Age of Onset  . Heart attack Mother     Social History Social History  Substance Use Topics  . Smoking status: Former Smoker    Packs/day: 1.50    Years: 25.00    Types: Cigarettes    Quit date: 04/28/2011  . Smokeless tobacco: Never Used  . Alcohol use No     Review of Systems  Constitutional: No fever/chills Eyes: No visual changes.  Cardiovascular: no chest pain. Respiratory: no cough. No SOB. Gastrointestinal: No abdominal pain.  No nausea, no vomiting.  No diarrhea.  No constipation. Musculoskeletal: Positive for neck, right elbow, left ankle, lower back pain. Skin: Negative for rash, abrasions, lacerations, ecchymosis. Neurological: Negative for headaches, focal weakness or numbness. 10-point ROS otherwise negative.  ____________________________________________   PHYSICAL EXAM:  VITAL SIGNS: ED Triage Vitals  Enc Vitals Group     BP 11/27/16 1711 119/74     Pulse Rate 11/27/16 1711 77     Resp 11/27/16 1711 18     Temp 11/27/16 1711 98.7 F (37.1 C)     Temp Source 11/27/16 1711 Oral     SpO2 11/27/16 1711 95 %     Weight 11/27/16 1712 200 lb (90.7 kg)     Height 11/27/16 1712 5\' 9"  (1.753  m)     Head Circumference --      Peak Flow --      Pain Score 11/27/16 1711 10     Pain Loc --      Pain Edu? --      Excl. in GC? --      Constitutional: Alert and oriented. Well appearing and in no acute distress. Eyes: Conjunctivae are normal. PERRL. EOMI. Head: Atraumatic. ENT:      Ears:       Nose: No congestion/rhinnorhea.      Mouth/Throat: Mucous membranes are moist.  Neck: No stridor.  Positive for diffuse midline cervical spine tenderness to palpation. She is also  tender to palpation bilateral paraspinal muscle groups. Radial pulse intact bilateral upper extremities. Sensation intact and equal bilateral upper x-rays. The visible deformity or palpable abnormality midline cervical spine.  Cardiovascular: Normal rate, regular rhythm. Normal S1 and S2.  Good peripheral circulation. Respiratory: Normal respiratory effort without tachypnea or retractions. Lungs CTAB. Good air entry to the bases with no decreased or absent breath sounds. Musculoskeletal: Full range of motion to all extremities. No gross deformities appreciated.Visualization of the lumbar spine reveals no visible deformity. Patient is tender to palpation diffusely midline through the lumbar spine. No palpable abnormality or step-off. Mild tenderness to palpation of the left sciatic notch, none over the right. Dorsalis pedis pulse intact bilateral lower shin these. Sensation intact and equal bilateral lower extremity's. Examination of the right elbow reveals mild ecchymosis to the posterior elbow with mild edema. No deformity. Full range of motion to the elbow. He is mildly tender palpation along the olecranon process. No palpable abnormality. Examination of the left ankle reveals no edema or deformity. Full range motion to ankle. Patient is tender along the talonavicular joint line. No palpable abnormality. Dorsalis pedis pulse intact.  Neurologic:  Normal speech and language. No gross focal neurologic deficits are  appreciated.  Skin:  Skin is warm, dry and intact. No rash noted. Psychiatric: Mood and affect are normal. Speech and behavior are normal. Patient exhibits appropriate insight and judgement.   ____________________________________________   LABS (all labs ordered are listed, but only abnormal results are displayed)  Labs Reviewed - No data to display ____________________________________________  EKG   ____________________________________________  RADIOLOGY Festus Barren Biana Haggar, personally viewed and evaluated these images (plain radiographs) as part of my medical decision making, as well as reviewing the written report by the radiologist.  Dg Elbow Complete Right  Result Date: 11/27/2016 CLINICAL DATA:  Fall down stairs today with right elbow pain, initial encounter EXAM: RIGHT ELBOW - COMPLETE 3+ VIEW COMPARISON:  None. FINDINGS: Some mild degenerative changes are noted about the articulation of the humerus and ulna as well as the radioulnar joint proximally. No joint effusion is seen. No acute fracture or dislocation is noted. No other soft tissue abnormality is noted. IMPRESSION: Degenerative change without acute abnormality. Electronically Signed   By: Alcide Clever M.D.   On: 11/27/2016 19:49   Dg Ankle Complete Left  Result Date: 11/27/2016 CLINICAL DATA:  Recent fall down stairs with left ankle pain, initial encounter EXAM: LEFT ANKLE COMPLETE - 3+ VIEW COMPARISON:  None. FINDINGS: There is no evidence of fracture, dislocation, or joint effusion. There is no evidence of arthropathy or other focal bone abnormality. Soft tissues are unremarkable. IMPRESSION: No acute abnormality noted. Electronically Signed   By: Alcide Clever M.D.   On: 11/27/2016 20:00   Ct Cervical Spine Wo Contrast  Result Date: 11/27/2016 CLINICAL DATA:  Patient fell down 4 steps today neck pain. EXAM: CT CERVICAL SPINE WITHOUT CONTRAST TECHNIQUE: Multidetector CT imaging of the cervical spine was performed  without intravenous contrast. Multiplanar CT image reconstructions were also generated. COMPARISON:  None. FINDINGS: Alignment: Reversal of normal cervical lordosis.  No subluxation. Skull base and vertebrae: No evidence of fracture. Soft tissues and spinal canal: No prevertebral fluid or swelling. No visible canal hematoma. Disc levels: Loss of disc height with endplate spurring noted C5-6 and C6-7. Upper chest: Negative. Other: None. IMPRESSION: 1. No evidence for cervical spine fracture. 2. Reversal of normal cervical lordosis. Loss of cervical lordosis. This can be related to patient positioning,  muscle spasm or soft tissue injury. 3. Degenerative changes at C5-6 and C6-7. Electronically Signed   By: Kennith Center M.D.   On: 11/27/2016 20:05   Ct Lumbar Spine Wo Contrast  Result Date: 11/27/2016 CLINICAL DATA:  Initial evaluation for acute trauma, fall. Lower back pain. EXAM: CT LUMBAR SPINE WITHOUT CONTRAST TECHNIQUE: Multidetector CT imaging of the lumbar spine was performed without intravenous contrast administration. Multiplanar CT image reconstructions were also generated. COMPARISON:  Prior CT from 12/17/2012. FINDINGS: Segmentation: Normal segmentation. Lowest well-formed disc is labeled the L5-S1 level. Alignment: Levoscoliosis with apex at L1-2. Trace retrolisthesis of L3 on L4, with 4 mm anterolisthesis of L5 on S1. Vertebrae: Vertebral body heights maintained. No evidence for acute fracture or malalignment. SI joints approximated and symmetric. Patient is status post posterior decompression with fusion at L4 through S1. Bilateral transpedicular screws in place at these levels. Interbody device is in place at L4-5 and L5-S1. Hardware intact. Diffuse circumferential lucency about the bilateral S1 screws. The left transpedicular screw at L4 extends into the L3-4 interspace. No other hardware complication. Paraspinal and other soft tissues: Paraspinous soft tissues demonstrate no acute abnormality.  Mild atheromatous plaque within the infrarenal aorta. Colonic diverticulosis noted. 4 mm nonobstructive left renal nephrolithiasis. Disc levels: L1-2:  Mild disc bulge.  No significant stenosis. L2-3: Mild circumferential disc bulge. Facet and ligamentum flavum hypertrophy. No significant stenosis. L3-4: A advanced degenerative intervertebral disc space narrowing with reactive endplate changes and diffuse disc bulge. Fairly advanced bilateral facet arthrosis. Moderate canal stenosis. Moderate bilateral foraminal narrowing. L4-5:  Status post fusion with decompression.  No residual stenosis. L5-S1: Status post fusion with decompression. No residual stenosis. IMPRESSION: 1. No acute traumatic injury within the lumbar spine. 2. Status post posterior fixation with fusion at L4 through S1. No acute hardware complication. 3. 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 4. 4 mm left renal nephrolithiasis. 5. Mild aortic atherosclerosis. Electronically Signed   By: Rise Mu M.D.   On: 11/27/2016 20:20    ____________________________________________    PROCEDURES  Procedure(s) performed:    Procedures    Medications  oxyCODONE-acetaminophen (PERCOCET/ROXICET) 5-325 MG per tablet 1 tablet (1 tablet Oral Given 11/27/16 2019)  ondansetron (ZOFRAN-ODT) disintegrating tablet 8 mg (8 mg Oral Given 11/27/16 2020)     ____________________________________________   INITIAL IMPRESSION / ASSESSMENT AND PLAN / ED COURSE  Pertinent labs & imaging results that were available during my care of the patient were reviewed by me and considered in my medical decision making (see chart for details).  Review of the [place] CSRS was performed in accordance of the [facility] prior to dispensing any controlled drugs.     Patient's diagnosis is consistent with Falling down 5 stairs with cervical strain, increased lumbar pain, elbow contusion, ankle  contusion. Patient imaging returned without any acute injury. Patient does have significant changes throughout the lumbar spine region above surgical fixation. When comparing with MRI from [facility], no changes from baseline. At this time, no concerning symptoms requiring further imaging with MRI.Marland Kitchen Patient will be discharged home with prescriptions for anti-inflammatory, muscle relaxer, limited pain medication. Patient is to follow up with neurosurgery as needed or otherwise directed. Patient is given ED precautions to return to the ED for any worsening or new symptoms.     ____________________________________________  FINAL CLINICAL IMPRESSION(S) / ED DIAGNOSES  Final diagnoses:  Fall, initial encounter  Strain of neck muscle, initial encounter  Acute midline low  back pain with left-sided sciatica  Contusion of right elbow, initial encounter  Contusion of left ankle, initial encounter      NEW MEDICATIONS STARTED DURING THIS VISIT:  New Prescriptions   HYDROCODONE-ACETAMINOPHEN (NORCO/VICODIN) 5-325 MG TABLET    Take 1 tablet by mouth every 4 (four) hours as needed for moderate pain.   MELOXICAM (MOBIC) 15 MG TABLET    Take 1 tablet (15 mg total) by mouth daily.   METHOCARBAMOL (ROBAXIN) 500 MG TABLET    Take 1 tablet (500 mg total) by mouth 4 (four) times daily.   ONDANSETRON (ZOFRAN-ODT) 4 MG DISINTEGRATING TABLET    Take 1 tablet (4 mg total) by mouth every 8 (eight) hours as needed for nausea or vomiting.        This chart was dictated using voice recognition software/Dragon. Despite best efforts to proofread, errors can occur which can change the meaning. Any change was purely unintentional.    Racheal Patches, PA-C 11/27/16 2138    Minna Antis, MD 11/27/16 [phone]

## 2016-11-27 NOTE — ED Notes (Signed)
PA at the bedside for pt evaluation 

## 2016-11-27 NOTE — ED Notes (Signed)
Pt discharged to home.  Family member driving.  Discharge instructions reviewed.  Verbalized understanding.  No questions or concerns at this time.  Teach back verified.  Pt in NAD.  No items left in ED.   

## 2016-11-27 NOTE — ED Notes (Signed)
See triage note  States he fell down steps last pm having pain to left elbow, left ankle and back

## 2016-11-27 NOTE — ED Triage Notes (Addendum)
Pt in via POV; pt reports falling down 4 outdoor steps today, pt states, "my left leg went out on me."  Pt denies hitting head, denies LOC, denies taking blood thinners.  Pt with complaints of neck pain, lower back pain, left leg and ankle pain.  Pt placed in C-collar on arrival to triage.  NAD noted at this time.

## 2016-12-12 ENCOUNTER — Other Ambulatory Visit (HOSPITAL_COMMUNITY): Payer: Self-pay | Admitting: Orthopaedic Surgery

## 2016-12-12 DIAGNOSIS — M961 Postlaminectomy syndrome, not elsewhere classified: Secondary | ICD-10-CM

## 2016-12-12 DIAGNOSIS — M4722 Other spondylosis with radiculopathy, cervical region: Secondary | ICD-10-CM

## 2016-12-16 ENCOUNTER — Ambulatory Visit (HOSPITAL_COMMUNITY)
Admission: RE | Admit: 2016-12-16 | Discharge: 2016-12-16 | Disposition: A | Discharge status: Home / Self Care | Payer: Medicare Other | Source: Ambulatory Visit | Attending: Orthopaedic Surgery | Admitting: Orthopaedic Surgery

## 2016-12-16 DIAGNOSIS — M960 Pseudarthrosis after fusion or arthrodesis: Secondary | ICD-10-CM | POA: Insufficient documentation

## 2016-12-16 DIAGNOSIS — M47896 Other spondylosis, lumbar region: Secondary | ICD-10-CM | POA: Insufficient documentation

## 2016-12-16 DIAGNOSIS — M48061 Spinal stenosis, lumbar region without neurogenic claudication: Secondary | ICD-10-CM | POA: Diagnosis not present

## 2016-12-16 DIAGNOSIS — M4722 Other spondylosis with radiculopathy, cervical region: Secondary | ICD-10-CM

## 2016-12-16 DIAGNOSIS — Y838 Other surgical procedures as the cause of abnormal reaction of the patient, or of later complication, without mention of misadventure at the time of the procedure: Secondary | ICD-10-CM | POA: Diagnosis not present

## 2016-12-16 DIAGNOSIS — M961 Postlaminectomy syndrome, not elsewhere classified: Secondary | ICD-10-CM | POA: Insufficient documentation

## 2016-12-16 DIAGNOSIS — M4802 Spinal stenosis, cervical region: Secondary | ICD-10-CM | POA: Diagnosis not present

## 2016-12-16 DIAGNOSIS — M50221 Other cervical disc displacement at C4-C5 level: Secondary | ICD-10-CM | POA: Insufficient documentation

## 2016-12-16 DIAGNOSIS — M47892 Other spondylosis, cervical region: Secondary | ICD-10-CM | POA: Diagnosis not present

## 2017-01-31 ENCOUNTER — Ambulatory Visit
Admission: RE | Admit: 2017-01-31 | Discharge: 2017-01-31 | Disposition: A | Discharge status: Home / Self Care | Payer: Medicare Other | Source: Ambulatory Visit | Attending: Gastroenterology | Admitting: Gastroenterology

## 2017-01-31 DIAGNOSIS — K219 Gastro-esophageal reflux disease without esophagitis: Secondary | ICD-10-CM | POA: Insufficient documentation

## 2017-01-31 DIAGNOSIS — K449 Diaphragmatic hernia without obstruction or gangrene: Secondary | ICD-10-CM | POA: Insufficient documentation

## 2017-01-31 DIAGNOSIS — R1310 Dysphagia, unspecified: Secondary | ICD-10-CM | POA: Diagnosis present

## 2017-02-02 ENCOUNTER — Encounter: Payer: Self-pay | Admitting: *Deleted

## 2017-02-05 ENCOUNTER — Encounter
Admission: RE | Disposition: A | Discharge status: Home / Self Care | Payer: Self-pay | Source: Ambulatory Visit | Attending: Gastroenterology

## 2017-02-05 ENCOUNTER — Encounter: Payer: Self-pay | Admitting: *Deleted

## 2017-02-05 ENCOUNTER — Ambulatory Visit: Payer: Medicare Other | Admitting: Anesthesiology

## 2017-02-05 ENCOUNTER — Ambulatory Visit
Admission: RE | Admit: 2017-02-05 | Discharge: 2017-02-05 | Disposition: A | Discharge status: Home / Self Care | Payer: Medicare Other | Source: Ambulatory Visit | Attending: Gastroenterology | Admitting: Gastroenterology

## 2017-02-05 DIAGNOSIS — Z79899 Other long term (current) drug therapy: Secondary | ICD-10-CM | POA: Diagnosis not present

## 2017-02-05 DIAGNOSIS — Z7982 Long term (current) use of aspirin: Secondary | ICD-10-CM | POA: Diagnosis not present

## 2017-02-05 DIAGNOSIS — Z955 Presence of coronary angioplasty implant and graft: Secondary | ICD-10-CM | POA: Insufficient documentation

## 2017-02-05 DIAGNOSIS — I10 Essential (primary) hypertension: Secondary | ICD-10-CM | POA: Insufficient documentation

## 2017-02-05 DIAGNOSIS — I2510 Atherosclerotic heart disease of native coronary artery without angina pectoris: Secondary | ICD-10-CM | POA: Diagnosis not present

## 2017-02-05 DIAGNOSIS — I252 Old myocardial infarction: Secondary | ICD-10-CM | POA: Diagnosis not present

## 2017-02-05 DIAGNOSIS — K635 Polyp of colon: Secondary | ICD-10-CM | POA: Insufficient documentation

## 2017-02-05 DIAGNOSIS — K2950 Unspecified chronic gastritis without bleeding: Secondary | ICD-10-CM | POA: Diagnosis not present

## 2017-02-05 DIAGNOSIS — J449 Chronic obstructive pulmonary disease, unspecified: Secondary | ICD-10-CM | POA: Diagnosis not present

## 2017-02-05 DIAGNOSIS — E7800 Pure hypercholesterolemia, unspecified: Secondary | ICD-10-CM | POA: Diagnosis not present

## 2017-02-05 DIAGNOSIS — K210 Gastro-esophageal reflux disease with esophagitis: Secondary | ICD-10-CM | POA: Insufficient documentation

## 2017-02-05 DIAGNOSIS — R1310 Dysphagia, unspecified: Secondary | ICD-10-CM | POA: Diagnosis not present

## 2017-02-05 DIAGNOSIS — K621 Rectal polyp: Secondary | ICD-10-CM | POA: Insufficient documentation

## 2017-02-05 DIAGNOSIS — K2960 Other gastritis without bleeding: Secondary | ICD-10-CM | POA: Diagnosis not present

## 2017-02-05 DIAGNOSIS — Z791 Long term (current) use of non-steroidal anti-inflammatories (NSAID): Secondary | ICD-10-CM | POA: Insufficient documentation

## 2017-02-05 DIAGNOSIS — Z1211 Encounter for screening for malignant neoplasm of colon: Secondary | ICD-10-CM | POA: Diagnosis not present

## 2017-02-05 DIAGNOSIS — Z951 Presence of aortocoronary bypass graft: Secondary | ICD-10-CM | POA: Diagnosis not present

## 2017-02-05 DIAGNOSIS — G4733 Obstructive sleep apnea (adult) (pediatric): Secondary | ICD-10-CM | POA: Diagnosis not present

## 2017-02-05 HISTORY — PX: ESOPHAGOGASTRODUODENOSCOPY (EGD) WITH PROPOFOL: SHX5813

## 2017-02-05 HISTORY — DX: Unspecified osteoarthritis, unspecified site: M19.90

## 2017-02-05 HISTORY — PX: COLONOSCOPY WITH PROPOFOL: SHX5780

## 2017-02-05 HISTORY — DX: Pure hypercholesterolemia, unspecified: E78.00

## 2017-02-05 HISTORY — DX: Presence of coronary angioplasty implant and graft: Z95.5

## 2017-02-05 SURGERY — COLONOSCOPY WITH PROPOFOL
Anesthesia: General

## 2017-02-05 MED ORDER — SODIUM CHLORIDE 0.9 % IV SOLN
INTRAVENOUS | Status: DC
  Administered 2017-02-05 (×3): via INTRAVENOUS

## 2017-02-05 MED ORDER — PROPOFOL 500 MG/50ML IV EMUL
INTRAVENOUS | Status: AC
  Filled 2017-02-05: qty 50

## 2017-02-05 MED ORDER — PROPOFOL 10 MG/ML IV BOLUS
INTRAVENOUS | Status: DC | PRN
  Administered 2017-02-05: 100 mg via INTRAVENOUS

## 2017-02-05 MED ORDER — EPHEDRINE SULFATE 50 MG/ML IJ SOLN
INTRAMUSCULAR | Status: DC | PRN
  Administered 2017-02-05 (×2): 10 mg via INTRAVENOUS

## 2017-02-05 MED ORDER — SODIUM CHLORIDE 0.9 % IV SOLN: INTRAVENOUS | Status: DC

## 2017-02-05 MED ORDER — PROPOFOL 500 MG/50ML IV EMUL
INTRAVENOUS | Status: DC | PRN
  Administered 2017-02-05: 150 ug/kg/min via INTRAVENOUS

## 2017-02-05 MED ORDER — PHENYLEPHRINE HCL 10 MG/ML IJ SOLN
INTRAMUSCULAR | Status: DC | PRN
  Administered 2017-02-05 (×9): 100 ug via INTRAVENOUS
  Administered 2017-02-05: 200 ug via INTRAVENOUS
  Administered 2017-02-05 (×3): 100 ug via INTRAVENOUS

## 2017-02-05 NOTE — H&P (Signed)
Outpatient short stay form Pre-procedure 02/05/2017 2:33 PM Christena Deem MD  Primary Physician: Martie Round NP  Reason for visit:  EGD and colonoscopy  History of present illness:  Patient is a 51 year old male presenting today as above. He has a history of gastroesophageal reflux which is responsive to a PPI taken regularly. There is also been some occasional difficulty with swallowing this being cervical in nature. He does not regurgitate foods. It is of note that patient may take up to 6 BC powders daily however states he hasn't taken a since last Wednesday. Does have a lot of issue with joint pains and headache. He takes no other aspirin or blood thinning agent with the exception of a 81 mg aspirin. He tolerated his prep well.  Of note patient does have a history of CABG as well as cardiac stents. He was previously on Plavix but not has been on that agent for some time.    Current Facility-Administered Medications:  .  0.9 %  sodium chloride infusion, , Intravenous, Continuous, Christena Deem, MD, Last Rate: 20 mL/hr at 02/05/17 1305 .  0.9 %  sodium chloride infusion, , Intravenous, Continuous, Christena Deem, MD  Prescriptions Prior to Admission  Medication Sig Dispense Refill Last Dose  . albuterol (PROVENTIL HFA;VENTOLIN HFA) 108 (90 Base) MCG/ACT inhaler Inhale 2 puffs into the lungs 2 (two) times daily.   Past Week at Unknown time  . aspirin 81 MG tablet Take 81 mg by mouth daily.   Past Week at Unknown time  . diazepam (VALIUM) 5 MG tablet Take 5 mg by mouth 2 (two) times daily.   02/05/2017 at 0900  . docusate sodium (COLACE) 100 MG capsule Take 100 mg by mouth 2 (two) times daily.   02/05/2017 at 0900  . DULoxetine (CYMBALTA) 30 MG capsule Take 30-60 mg by mouth 2 (two) times daily. Take 30mg  in morning and take 60mg  at night   02/05/2017 at 0900  . esomeprazole (NEXIUM) 40 MG capsule Take 1 capsule (40 mg total) by mouth daily before breakfast. 30 capsule 11  02/05/2017 at 0900  . HYDROcodone-acetaminophen (NORCO/VICODIN) 5-325 MG tablet Take 1 tablet by mouth every 4 (four) hours as needed for moderate pain. 20 tablet 0 02/04/2017 at Unknown time  . levalbuterol (XOPENEX HFA) 45 MCG/ACT inhaler Inhale 1-2 puffs into the lungs every 4 (four) hours as needed. 1 Inhaler 1 Past Week at Unknown time  . lisinopril (PRINIVIL,ZESTRIL) 2.5 MG tablet Take 2.5 mg by mouth every evening.   02/05/2017 at 0900  . meloxicam (MOBIC) 15 MG tablet Take 1 tablet (15 mg total) by mouth daily. 30 tablet 0 Past Week at Unknown time  . methocarbamol (ROBAXIN) 500 MG tablet Take 1 tablet (500 mg total) by mouth 4 (four) times daily. 16 tablet 0 02/04/2017 at Unknown time  . metoprolol tartrate (LOPRESSOR) 25 MG tablet TAKE 1 TABLET BY MOUTH TWICE DAILY 180 tablet 3 02/05/2017 at 0900  . NON FORMULARY Stool softner takes 1 tablet daily.   02/05/2017 at 0900  . ondansetron (ZOFRAN) 4 MG tablet Take 4 mg by mouth as needed for nausea.   02/04/2017 at Unknown time  . ondansetron (ZOFRAN-ODT) 4 MG disintegrating tablet Take 1 tablet (4 mg total) by mouth every 8 (eight) hours as needed for nausea or vomiting. 20 tablet 0 Past Month at Unknown time  . rosuvastatin (CRESTOR) 20 MG tablet Take 20 mg by mouth every evening.   02/05/2017 at 0900  .  sucralfate (CARAFATE) 1 g tablet Take 1 g by mouth 4 (four) times daily -  with meals and at bedtime.   02/04/2017 at Unknown time  . tamsulosin (FLOMAX) 0.4 MG CAPS capsule Take 0.4 mg by mouth daily.   02/04/2017 at Unknown time  . topiramate (TOPAMAX) 25 MG tablet Take 50 mg by mouth every evening.   02/04/2017 at Unknown time  . traMADol (ULTRAM) 50 MG tablet Take 2 tablets (100 mg total) by mouth every 6 (six) hours as needed for severe pain. 20 tablet 0 Past Month at Unknown time  . polyethylene glycol powder (GLYCOLAX/MIRALAX) powder Take 1 Container by mouth once.   Completed Course at Unknown time     Allergies  Allergen Reactions  .  Codeine Nausea And Vomiting  . Vicodin [Hydrocodone-Acetaminophen] Hives     Past Medical History:  Diagnosis Date  . Acute non-ST segment elevation myocardial infarction (HCC)   . Arthritis   . CAD, multiple vessel   . COPD (chronic obstructive pulmonary disease) (HCC)   . GERD (gastroesophageal reflux disease)   . History of heart artery stent   . Hypercholesteremia   . Hypertension   . Levoscoliosis    multi level degenrative disc disease  . Obstructive sleep apnea   . Tobacco abuse     Review of systems:      Physical Exam    Heart and lungs: Regular rate and rhythm without rub or gallop lungs are bilaterally clear    HEENT: Normocephalic atraumatic eyes are anicteric    Other:     Pertinant exam for procedure: Soft nontender nondistended bowel sounds positive normoactive. I have discussed the risks benefits and complications of procedures to include not limited to bleeding, infection, perforation and the risk of sedation and the patient wishes to proceed.    Planned proceedures: EGD, colonoscopy and indicated procedures. I have discussed the risks benefits and complications of procedures to include not limited to bleeding, infection, perforation and the risk of sedation and the patient wishes to proceed.    Christena Deem, MD Gastroenterology 02/05/2017  2:33 PM

## 2017-02-05 NOTE — Op Note (Addendum)
[facility] Patient Name: [name] Procedure Date: 02/05/2017 2:30 PM MRN: [id number] Account #: [id number] Date of Birth: 08/19/1966 Admit Type: Outpatient Age: 51 Room: [facility] ENDO ROOM 1 Gender: Male Note Status: Finalized Procedure:            Colonoscopy Indications:          Screening for colorectal malignant neoplasm, This is                        the patient's first colonoscopy Providers:            Christena Deem, MD Referring MD:         Clinic [facility], MD (Referring MD) Medicines:            Monitored Anesthesia Care Complications:        No immediate complications. Procedure:            Pre-Anesthesia Assessment:                       - ASA Grade Assessment: III - A patient with severe                        systemic disease.                       After obtaining informed consent, the colonoscope was                        passed under direct vision. Throughout the procedure,                        the patient's blood pressure, pulse, and oxygen                        saturations were monitored continuously. The                        Colonoscope was introduced through the anus with the                        intention of advancing to the cecum. The scope was                        advanced to the sigmoid colon before the procedure was                        aborted. Medications were given. The colonoscopy was                        unusually difficult due to poor bowel prep with stool                        present. The quality of the bowel preparation was poor. Findings:      A 3 mm polyp was found in the rectum. The polyp was sessile. The polyp       was removed with a cold biopsy forceps. Resection and retrieval were       complete.      A 3 mm polyp was found in the distal sigmoid colon. The polyp was  sessile. The polyp was removed with a cold biopsy forceps. Resection and       retrieval were  complete.      A large amount of semi-liquid solid stool was found in the rectum and in       the sigmoid colon, precluding visualization. Several other small polyps       were noted however the prep was not adequate to assure clearance.      The digital rectal exam was normal. Impression:           - Preparation of the colon was poor.                       - One 3 mm polyp in the rectum, removed with a cold                        biopsy forceps. Resected and retrieved.                       - One 3 mm polyp in the distal sigmoid colon, removed                        with a cold biopsy forceps. Resected and retrieved.                       - Stool in the rectum and in the sigmoid colon. Recommendation:       - Discharge patient to home.                       - Await pathology results.                       - Repeat prep and reschedule. Christena Deem, MD 02/05/2017 3:29:47 PM This report has been signed electronically. Number of Addenda: 0 Note Initiated On: 02/05/2017 2:30 PM Total Procedure Duration: 0 hours 10 minutes 12 seconds       [facility]

## 2017-02-05 NOTE — Anesthesia Postprocedure Evaluation (Signed)
Anesthesia Post Note  Patient: Jafar Poffenberger.  Procedure(s) Performed: Procedure(s) (LRB): COLONOSCOPY WITH PROPOFOL (N/A) ESOPHAGOGASTRODUODENOSCOPY (EGD) WITH PROPOFOL (N/A)  Patient location during evaluation: Endoscopy Anesthesia Type: General Level of consciousness: awake and alert Pain management: pain level controlled Vital Signs Assessment: post-procedure vital signs reviewed and stable Respiratory status: spontaneous breathing, nonlabored ventilation, respiratory function stable and patient connected to nasal cannula oxygen Cardiovascular status: blood pressure returned to baseline and stable Postop Assessment: no signs of nausea or vomiting Anesthetic complications: no     Last Vitals:  Vitals:   02/05/17 1540 02/05/17 1550  BP: (!) 94/59 96/64  Pulse: 66 66  Resp: 18 16  Temp:      Last Pain:  Vitals:   02/05/17 1530  TempSrc: Tympanic                 Adlai Nieblas S

## 2017-02-05 NOTE — Anesthesia Post-op Follow-up Note (Cosign Needed)
Anesthesia QCDR form completed.        

## 2017-02-05 NOTE — Anesthesia Preprocedure Evaluation (Signed)
Anesthesia Evaluation  Patient identified by MRN, date of birth, ID band Patient awake    Reviewed: Allergy & Precautions, NPO status , Patient's Chart, lab work & pertinent test results  Airway Mallampati: II       Dental  (+) Teeth Intact   Pulmonary COPD, Current Smoker,     + decreased breath sounds      Cardiovascular hypertension, + CAD and + Past MI   Rhythm:Regular Rate:Normal     Neuro/Psych    GI/Hepatic Neg liver ROS, GERD  Medicated,  Endo/Other  negative endocrine ROS  Renal/GU negative Renal ROS     Musculoskeletal   Abdominal Normal abdominal exam  (+)   Peds negative pediatric ROS (+)  Hematology negative hematology ROS (+)   Anesthesia Other Findings   Reproductive/Obstetrics                             Anesthesia Physical Anesthesia Plan  ASA: III  Anesthesia Plan: General   Post-op Pain Management:    Induction: Intravenous  PONV Risk Score and Plan: 0  Airway Management Planned: Natural Airway and Nasal Cannula  Additional Equipment:   Intra-op Plan:   Post-operative Plan:   Informed Consent: I have reviewed the patients History and Physical, chart, labs and discussed the procedure including the risks, benefits and alternatives for the proposed anesthesia with the patient or authorized representative who has indicated his/her understanding and acceptance.     Plan Discussed with: CRNA  Anesthesia Plan Comments:         Anesthesia Quick Evaluation

## 2017-02-05 NOTE — Transfer of Care (Signed)
Immediate Anesthesia Transfer of Care Note  Patient: [name].  Procedure(s) Performed: Procedure(s): COLONOSCOPY WITH PROPOFOL (N/A) ESOPHAGOGASTRODUODENOSCOPY (EGD) WITH PROPOFOL (N/A)  Patient Location: PACU  Anesthesia Type:General  Level of Consciousness: awake, alert  and oriented  Airway & Oxygen Therapy: Patient Spontanous Breathing and Patient connected to nasal cannula oxygen  Post-op Assessment: Report given to RN and Post -op Vital signs reviewed and stable  Post vital signs: Reviewed and stable  Last Vitals:  Vitals:   02/05/17 1251 02/05/17 1530  BP: 105/63 (!) 84/57  Pulse: 76 82  Resp: 18 18  Temp: (!) 36 C (!) 36 C    Last Pain:  Vitals:   02/05/17 1530  TempSrc: Tympanic         Complications: No apparent anesthesia complications

## 2017-02-05 NOTE — Op Note (Signed)
[facility] Patient Name: [name] Procedure Date: 02/05/2017 2:31 PM MRN: [id number] Account #: [id number] Date of Birth: 18-Jun-1966 Admit Type: Outpatient Age: 51 Room: [facility] ENDO ROOM 1 Gender: Male Note Status: Finalized Procedure:            Upper GI endoscopy Indications:          Dysphagia, Heartburn, Gastro-esophageal reflux disease Providers:            Christena Deem, MD Referring MD:         Clinic [facility], MD (Referring MD) Medicines:            Monitored Anesthesia Care Complications:        No immediate complications. Procedure:            Pre-Anesthesia Assessment:                       - ASA Grade Assessment: III - A patient with severe                        systemic disease.                       After obtaining informed consent, the endoscope was                        passed under direct vision. Throughout the procedure,                        the patient's blood pressure, pulse, and oxygen                        saturations were monitored continuously. The Endoscope                        was introduced through the mouth, and advanced to the                        third part of duodenum. The upper GI endoscopy was                        accomplished without difficulty. The patient tolerated                        the procedure well. Findings:      LA Grade B (one or more mucosal breaks greater than 5 mm, not extending       between the tops of two mucosal folds) esophagitis with no bleeding was       found. Biopsies were taken with a cold forceps for histology.      Diffuse and patchy moderate inflammation characterized by adherent       blood, congestion (edema), granularity, linear erosions and shallow       ulcerations was found in the gastric antrum. Biopsies were taken with a       cold forceps for histology. Biopsies were taken with a cold forceps for       Helicobacter pylori testing.  Diffuse mild mucosal variance characterized by texture change with       linear furrow was found in the entire esophagus, concern for possible       eosinophillic esophagitis. Biopsies were taken  with a cold forceps for       histology. No evidence of stenosis or stricture.      The cardia and gastric fundus were normal on retroflexion.      The examined duodenum was normal. Impression:           - LA Grade B erosive esophagitis. Biopsied.                       - Erosive gastritis. Biopsied.                       - Esophageal mucosal variant. Biopsied.                       - Normal examined duodenum. Recommendation:       - Await pathology results.                       - No aspirin, ibuprofen, naproxen, or other                        non-steroidal anti-inflammatory drugs.                       - Refer to neurologist for evauation of headache. at                        appointment to be scheduled.                       - Return to GI clinic in 4 weeks. Procedure Code(s):    --- Professional ---                       [id number], Esophagogastroduodenoscopy, flexible, transoral;                        with biopsy, single or multiple Diagnosis Code(s):    --- Professional ---                       K20.8, Other esophagitis                       K29.60, Other gastritis without bleeding                       K22.8, Other specified diseases of esophagus                       R13.10, Dysphagia, unspecified                       R12, Heartburn                       K21.9, Gastro-esophageal reflux disease without                        esophagitis CPT copyright 2016 American Medical Association. All rights reserved. The codes documented in this report are preliminary and upon coder review may  be revised to meet current compliance requirements. Christena Deem, MD 02/05/2017 3:07:56 PM This report has been signed electronically. Number of Addenda: 0 Note Initiated On: 02/05/2017 2:31 PM Total  Procedure Duration:  0 hours 16 minutes 9 seconds       [facility]

## 2017-02-06 ENCOUNTER — Encounter: Payer: Self-pay | Admitting: Gastroenterology

## 2017-02-07 LAB — SURGICAL PATHOLOGY

## 2018-02-18 ENCOUNTER — Encounter: Payer: Self-pay | Admitting: Cardiovascular Disease

## 2018-04-08 ENCOUNTER — Other Ambulatory Visit: Payer: Self-pay | Admitting: Gastroenterology

## 2018-04-08 DIAGNOSIS — R1013 Epigastric pain: Secondary | ICD-10-CM

## 2018-04-15 ENCOUNTER — Ambulatory Visit
Admission: RE | Admit: 2018-04-15 | Discharge: 2018-04-15 | Disposition: A | Discharge status: Home / Self Care | Payer: Medicare Other | Source: Ambulatory Visit | Attending: Gastroenterology | Admitting: Gastroenterology

## 2018-04-15 DIAGNOSIS — K8020 Calculus of gallbladder without cholecystitis without obstruction: Secondary | ICD-10-CM | POA: Insufficient documentation

## 2018-04-15 DIAGNOSIS — R1013 Epigastric pain: Secondary | ICD-10-CM | POA: Insufficient documentation

## 2018-04-16 ENCOUNTER — Ambulatory Visit: Payer: Medicare Other

## 2018-04-16 ENCOUNTER — Encounter: Payer: Self-pay | Admitting: Emergency Medicine

## 2018-04-16 ENCOUNTER — Other Ambulatory Visit: Payer: Self-pay

## 2018-04-16 ENCOUNTER — Ambulatory Visit
Admission: EM | Admit: 2018-04-16 | Discharge: 2018-04-16 | Disposition: A | Discharge status: Home / Self Care | Payer: Medicare Other | Attending: Family Medicine | Admitting: Family Medicine

## 2018-04-16 DIAGNOSIS — Z951 Presence of aortocoronary bypass graft: Secondary | ICD-10-CM | POA: Insufficient documentation

## 2018-04-16 DIAGNOSIS — K219 Gastro-esophageal reflux disease without esophagitis: Secondary | ICD-10-CM | POA: Insufficient documentation

## 2018-04-16 DIAGNOSIS — F17210 Nicotine dependence, cigarettes, uncomplicated: Secondary | ICD-10-CM | POA: Diagnosis not present

## 2018-04-16 DIAGNOSIS — G4733 Obstructive sleep apnea (adult) (pediatric): Secondary | ICD-10-CM | POA: Insufficient documentation

## 2018-04-16 DIAGNOSIS — Z96651 Presence of right artificial knee joint: Secondary | ICD-10-CM | POA: Insufficient documentation

## 2018-04-16 DIAGNOSIS — M1990 Unspecified osteoarthritis, unspecified site: Secondary | ICD-10-CM | POA: Diagnosis not present

## 2018-04-16 DIAGNOSIS — I252 Old myocardial infarction: Secondary | ICD-10-CM | POA: Insufficient documentation

## 2018-04-16 DIAGNOSIS — S9032XA Contusion of left foot, initial encounter: Secondary | ICD-10-CM | POA: Diagnosis not present

## 2018-04-16 DIAGNOSIS — J449 Chronic obstructive pulmonary disease, unspecified: Secondary | ICD-10-CM | POA: Diagnosis not present

## 2018-04-16 DIAGNOSIS — W3189XA Contact with other specified machinery, initial encounter: Secondary | ICD-10-CM | POA: Insufficient documentation

## 2018-04-16 DIAGNOSIS — M79672 Pain in left foot: Secondary | ICD-10-CM | POA: Diagnosis present

## 2018-04-16 DIAGNOSIS — I10 Essential (primary) hypertension: Secondary | ICD-10-CM | POA: Insufficient documentation

## 2018-04-16 DIAGNOSIS — Z791 Long term (current) use of non-steroidal anti-inflammatories (NSAID): Secondary | ICD-10-CM | POA: Diagnosis not present

## 2018-04-16 DIAGNOSIS — E7800 Pure hypercholesterolemia, unspecified: Secondary | ICD-10-CM | POA: Insufficient documentation

## 2018-04-16 DIAGNOSIS — Z885 Allergy status to narcotic agent status: Secondary | ICD-10-CM | POA: Diagnosis not present

## 2018-04-16 DIAGNOSIS — Z79899 Other long term (current) drug therapy: Secondary | ICD-10-CM | POA: Diagnosis not present

## 2018-04-16 DIAGNOSIS — I2510 Atherosclerotic heart disease of native coronary artery without angina pectoris: Secondary | ICD-10-CM | POA: Diagnosis not present

## 2018-04-16 DIAGNOSIS — Z8249 Family history of ischemic heart disease and other diseases of the circulatory system: Secondary | ICD-10-CM | POA: Diagnosis not present

## 2018-04-16 DIAGNOSIS — M25572 Pain in left ankle and joints of left foot: Secondary | ICD-10-CM | POA: Diagnosis not present

## 2018-04-16 MED ORDER — KETOROLAC TROMETHAMINE 60 MG/2ML IM SOLN
60.0000 mg | Freq: Once | INTRAMUSCULAR | Status: AC
  Administered 2018-04-16: 60 mg via INTRAMUSCULAR

## 2018-04-16 NOTE — ED Provider Notes (Signed)
[facility]    CSN: [id number] Arrival date & time: 04/16/18  1220     History   Chief Complaint Chief Complaint  Patient presents with  . Foot Injury    HPI [name]. is a 52 y.o. male.   HPI  52 year old male presents with a swollen tender left foot.  He states that he and a coworker were pushing a trailer that was stuck utilizing a 0 turn lawnmower when the lawn more became wedged under the trailer; the trailer lifted and the axle entrapped  his left foot.  It stopped just above his ankle on the tibia.  He states he continued to work after the  7:00 accident but later last night and this morning the pain became much more noticeable with swelling and blueness of his foot.  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  That has been chronic and persistent.  Used ibuprofen last night but because of increasing pain came in for examination today.         Past Medical History:  Diagnosis Date  . Acute non-ST segment elevation myocardial infarction (HCC)   . Arthritis   . CAD, multiple vessel   . COPD (chronic obstructive pulmonary disease) (HCC)   . GERD (gastroesophageal reflux disease)   . History of heart artery stent   . Hypercholesteremia   . Hypertension   . Levoscoliosis    multi level degenrative disc disease  . Obstructive sleep apnea   . Tobacco abuse     Patient Active Problem List   Diagnosis Date Noted  . Acute pulmonary edema (HCC)   . Elevated troponin   . Centrilobular emphysema (HCC)   . Bronchitis   . Right-sided thoracic back pain   . Short of breath on exertion   . Cough   . Chest pain 05/15/2016  . Previous back surgery 06/18/2013  . DJD (degenerative joint disease) 12/16/2012  . Nausea 08/02/2011  . 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 Smoking hx 05/18/2011  . Hyperlipidemia LDL goal <70 05/18/2011    Past Surgical History:  Procedure Laterality Date  . BACK SURGERY     Degenerative Scoliosis T10-S1   . CARDIAC CATHETERIZATION  2013   right leg  . COLONOSCOPY WITH PROPOFOL N/A 02/05/2017   Procedure: COLONOSCOPY WITH PROPOFOL;  Surgeon: Christena Deem, MD;  Location: [facility] ENDOSCOPY;  Service: Endoscopy;  Laterality: N/A;  . CORONARY ARTERY BYPASS GRAFT  05/01/2011   x 4  . ESOPHAGOGASTRODUODENOSCOPY (EGD) WITH PROPOFOL N/A 02/05/2017   Procedure: ESOPHAGOGASTRODUODENOSCOPY (EGD) WITH PROPOFOL;  Surgeon: Christena Deem, MD;  Location: [facility] ENDOSCOPY;  Service: Endoscopy;  Laterality: N/A;  . JOINT REPLACEMENT    . KNEE SURGERY     right knee       Home Medications    Prior to Admission medications   Medication Sig Start Date End Date Taking? Authorizing Provider  albuterol (PROVENTIL HFA;VENTOLIN HFA) 108 (90 Base) MCG/ACT inhaler Inhale 2 puffs into the lungs 2 (two) times daily.   Yes [provider]  docusate sodium (COLACE) 100 MG capsule Take 100 mg by mouth 2 (two) times daily.   Yes [provider]  DULoxetine (CYMBALTA) 30 MG capsule Take 30-60 mg by mouth 2 (two) times daily. Take 30mg  in morning and take 60mg  at night   Yes [provider]  esomeprazole (NEXIUM) 40 MG capsule Take 1 capsule (40  mg total) by mouth daily before breakfast. 06/18/13  Yes Gollan, Tollie Pizza, MD  lisinopril (PRINIVIL,ZESTRIL) 2.5 MG tablet Take 2.5 mg by mouth every evening.   Yes [provider]  meloxicam (MOBIC) 15 MG tablet Take 1 tablet (15 mg total) by mouth daily. 11/27/16  Yes Cuthriell, Delorise Royals, PA-C  methocarbamol (ROBAXIN) 500 MG tablet Take 1 tablet (500 mg total) by mouth 4 (four) times daily. 11/27/16  Yes Cuthriell, Delorise Royals, PA-C  metoprolol tartrate (LOPRESSOR) 25 MG tablet TAKE 1 TABLET BY MOUTH TWICE DAILY 06/18/13  Yes Gollan, Tollie Pizza, MD  NON FORMULARY Stool softner takes 1 tablet daily.   Yes [provider]  ondansetron (ZOFRAN) 4 MG tablet Take 4 mg by mouth as needed for nausea.   Yes  [provider]  rosuvastatin (CRESTOR) 20 MG tablet Take 20 mg by mouth every evening.   Yes [provider]  sucralfate (CARAFATE) 1 g tablet Take 1 g by mouth 4 (four) times daily -  with meals and at bedtime.   Yes [provider]  tamsulosin (FLOMAX) 0.4 MG CAPS capsule Take 0.4 mg by mouth daily.   Yes [provider]  topiramate (TOPAMAX) 25 MG tablet Take 50 mg by mouth every evening.   Yes [provider]  traMADol (ULTRAM) 50 MG tablet Take 2 tablets (100 mg total) by mouth every 6 (six) hours as needed for severe pain. 05/16/16  Yes Enid Baas, MD    Family History Family History  Problem Relation Age of Onset  . Heart attack Mother     Social History Social History   Tobacco Use  . Smoking status: Current Every Day Smoker    Packs/day: 1.00    Years: 25.00    Pack years: 25.00    Types: Cigarettes    Last attempt to quit: 04/28/2011    Years since quitting: 6.9  . Smokeless tobacco: Never Used  Substance Use Topics  . Alcohol use: No  . Drug use: No     Allergies   Codeine and Vicodin [hydrocodone-acetaminophen]   Review of Systems Review of Systems  Constitutional: Positive for activity change. Negative for appetite change, chills, fatigue and fever.  Musculoskeletal: Positive for arthralgias and gait problem.  All other systems reviewed and are negative.    Physical Exam Triage Vital Signs ED Triage Vitals  Enc Vitals Group     BP 04/16/18 1235 119/78     Pulse Rate 04/16/18 1235 (!) 120     Resp 04/16/18 1235 16     Temp 04/16/18 1235 98.4 F (36.9 C)     Temp Source 04/16/18 1235 Oral     SpO2 04/16/18 1235 97 %     Weight 04/16/18 1231 195 lb (88.5 kg)     Height 04/16/18 1231 5\' 8"  (1.727 m)     Head Circumference --      Peak Flow --      Pain Score 04/16/18 1231 10     Pain Loc --      Pain Edu? --      Excl. in GC? --    No data found.  Updated Vital Signs BP 119/78 (BP Location:  Left Arm)   Pulse (!) 120   Temp 98.4 F (36.9 C) (Oral)   Resp 16   Ht 5\' 8"  (1.727 m)   Wt 195 lb (88.5 kg)   SpO2 97%   BMI 29.65 kg/m   Visual Acuity Right Eye Distance:  Left Eye Distance:   Bilateral Distance:    Right Eye Near:   Left Eye Near:    Bilateral Near:     Physical Exam  Constitutional: He is oriented to person, place, and time. He appears well-developed and well-nourished. No distress.  HENT:  Head: Normocephalic.  Eyes: Pupils are equal, round, and reactive to light. Right eye exhibits no discharge. Left eye exhibits no discharge.  Neck: Normal range of motion.  Musculoskeletal: He exhibits edema and tenderness.  Exam of the left foot shows it to be swollen ecchymotic towards the toes.  Is a small abrasion over the distal tibia.  Tenderness is generalized over the dorsum of the foot over the tarsals and metatarsals.  He does have mild tenderness over the medial malleolus.  No crepitus is appreciated.  No breaks of the skin are noticed on the foot other than meant that mentioned above on the tibia.  Neurological: He is alert and oriented to person, place, and time.  Skin: Skin is warm and dry. He is not diaphoretic.  Psychiatric: He has a normal mood and affect. His behavior is normal. Judgment and thought content normal.  Nursing note and vitals reviewed.    UC Treatments / Results  Labs (all labs ordered are listed, but only abnormal results are displayed) Labs Reviewed - No data to display  EKG None  Radiology Dg Ankle Complete Left  Result Date: 04/16/2018 CLINICAL DATA:  Ricki Miller ran over ankle and foot.  Pain and bruising. EXAM: LEFT ANKLE COMPLETE - 3+ VIEW COMPARISON:  11/27/2016 FINDINGS: Negative for fracture or dislocation. Normal alignment. No focal soft tissue swelling. Again noted are small calcaneal spurs. IMPRESSION: No acute bone abnormality to the left ankle. Electronically Signed   By: Richarda Overlie M.D.   On: 04/16/2018 13:26   Dg  Foot Complete Left  Result Date: 04/16/2018 CLINICAL DATA:  Left ankle and foot pain, bruising, and swelling after it was run over by a trailer yesterday. Initial encounter. EXAM: LEFT FOOT - COMPLETE 3+ VIEW COMPARISON:  Left ankle radiographs 11/27/2016 FINDINGS: There is no evidence of acute fracture or dislocation. Prominent soft tissue swelling is noted throughout the forefoot. A small posterior calcaneal enthesophyte and mild distal anterior tibial spurring are again seen. IMPRESSION: Soft tissue swelling without evidence of acute osseous abnormality. Electronically Signed   By: Sebastian Ache M.D.   On: 04/16/2018 13:28   US Abdomen Limited Ruq  Result Date: 04/15/2018 CLINICAL DATA:  Epigastric discomfort EXAM: ULTRASOUND ABDOMEN LIMITED RIGHT UPPER QUADRANT COMPARISON:  None. FINDINGS: Gallbladder: Echogenic mobile shadowing stones are observed. There is some echogenic bile or sludge as well. There is no gallbladder wall thickening, pericholecystic fluid, or positive sonographic Murphy's sign. Common bile duct: Diameter: 3 mm Liver: The hepatic echotexture is normal. The surface contour of the liver is smooth. There is no focal mass nor ductal dilation. Portal vein is patent on color Doppler imaging with normal direction of blood flow towards the liver. IMPRESSION: Gallstones and sludge. No sonographic evidence of acute cholecystitis. Normal appearing liver and common bile duct. Electronically Signed   By: David  [place] M.D.   On: 04/15/2018 15:18    Procedures Procedures (including critical care time)  Medications Ordered in UC Medications  ketorolac (TORADOL) injection 60 mg (60 mg Intramuscular Given 04/16/18 1317)    Initial Impression / Assessment and Plan / UC Course  I have reviewed the triage vital signs and the nursing notes.  Pertinent labs & imaging  results that were available during my care of the patient were reviewed by me and considered in my medical decision making (see  chart for details).     Plan: 1. Test/x-ray results and diagnosis reviewed with patient 2. rx as per orders; risks, benefits, potential side effects reviewed with patient 3. Recommend supportive treatment with Apply ice 20 minutes out of every 2 hours 4-5 times daily for comfort.  Elevate above your heart sufficiently to control swelling and pain.  Use the postop shoe for comfort when ambulating.  You have a walker at home use that for stability and to assist in walking.  Recommend touch down gait initially and then progressing to partial weightbearing full weightbearing as tolerated.  If not improving follow-up with Dr. Ether Griffins podiatrist.  Use Tylenol 500 mg combined with ibuprofen 400 mg every 6 hours as necessary for pain 4. F/u prn if symptoms worsen or don't improve  Final Clinical Impressions(s) / UC Diagnoses   Final diagnoses:  Contusion of left foot, initial encounter     Discharge Instructions     Apply ice 20 minutes out of every 2 hours 4-5 times daily for comfort.     ED Prescriptions    None     Controlled Substance Prescriptions [place] Controlled Substance Registry consulted? Not Applicable   Lutricia Feil, PA-C 04/16/18 1353

## 2018-04-16 NOTE — ED Triage Notes (Signed)
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 Patient now has c/o pain, swelling and bruising.

## 2018-04-16 NOTE — Discharge Instructions (Signed)
Apply ice 20 minutes out of every 2 hours 4-5 times daily for comfort.  °

## 2018-04-25 ENCOUNTER — Other Ambulatory Visit: Payer: Self-pay | Admitting: Surgery

## 2018-04-25 DIAGNOSIS — R1084 Generalized abdominal pain: Secondary | ICD-10-CM

## 2018-04-27 ENCOUNTER — Encounter: Payer: Medicare Other | Attending: Surgery

## 2018-05-08 ENCOUNTER — Ambulatory Visit: Admission: RE | Admit: 2018-05-08 | Payer: Medicare Other | Source: Ambulatory Visit | Admitting: Internal Medicine

## 2018-05-08 ENCOUNTER — Encounter: Admission: RE | Payer: Self-pay | Source: Ambulatory Visit

## 2018-05-08 SURGERY — EGD (ESOPHAGOGASTRODUODENOSCOPY)
Anesthesia: General

## 2019-01-14 ENCOUNTER — Encounter: Payer: Self-pay | Admitting: Emergency Medicine

## 2019-01-14 ENCOUNTER — Emergency Department
Admission: EM | Admit: 2019-01-14 | Discharge: 2019-01-14 | Disposition: A | Discharge status: Home / Self Care | Payer: Medicare Other | Attending: Emergency Medicine | Admitting: Emergency Medicine

## 2019-01-14 ENCOUNTER — Other Ambulatory Visit: Payer: Self-pay

## 2019-01-14 DIAGNOSIS — R109 Unspecified abdominal pain: Secondary | ICD-10-CM | POA: Insufficient documentation

## 2019-01-14 DIAGNOSIS — Z5321 Procedure and treatment not carried out due to patient leaving prior to being seen by health care provider: Secondary | ICD-10-CM | POA: Insufficient documentation

## 2019-01-14 LAB — COMPREHENSIVE METABOLIC PANEL
ALT: 16 U/L (ref 0–44)
AST: 18 U/L (ref 15–41)
Albumin: 4.2 g/dL (ref 3.5–5.0)
Alkaline Phosphatase: 74 U/L (ref 38–126)
Anion gap: 9 (ref 5–15)
BUN: 22 mg/dL — ABNORMAL HIGH (ref 6–20)
CO2: 23 mmol/L (ref 22–32)
Calcium: 8.6 mg/dL — ABNORMAL LOW (ref 8.9–10.3)
Chloride: 106 mmol/L (ref 98–111)
Creatinine, Ser: 1.19 mg/dL (ref 0.61–1.24)
GFR calc Af Amer: >60 mL/min (ref >60)
GFR calc non Af Amer: >60 mL/min (ref >60)
Glucose, Bld: 101 mg/dL — ABNORMAL HIGH (ref 70–99)
Potassium: 4.2 mmol/L (ref 3.5–5.1)
Sodium: 138 mmol/L (ref 135–145)
Total Bilirubin: 0.4 mg/dL (ref 0.3–1.2)
Total Protein: 7 g/dL (ref 6.5–8.1)

## 2019-01-14 LAB — CBC
HCT: 49.6 % (ref 39.0–52.0)
Hemoglobin: 16.5 g/dL (ref 13.0–17.0)
MCH: 31.2 pg (ref 26.0–34.0)
MCHC: 33.3 g/dL (ref 30.0–36.0)
MCV: 93.8 fL (ref 80.0–100.0)
Platelets: 226 10*3/uL (ref 150–400)
RBC: 5.29 MIL/uL (ref 4.22–5.81)
RDW: 13.2 % (ref 11.5–15.5)
WBC: 7 10*3/uL (ref 4.0–10.5)
nRBC: 0 % (ref 0.0–0.2)

## 2019-01-14 LAB — LIPASE, BLOOD: Lipase: 30 U/L (ref 11–51)

## 2019-01-14 MED ORDER — SODIUM CHLORIDE 0.9% FLUSH: 3.0000 mL | Freq: Once | INTRAVENOUS | Status: DC

## 2019-01-14 NOTE — ED Triage Notes (Signed)
Pt here with c/o "gallbladder attack." Has gallstones now, diagnosed a year ago, pain LUQ. NAD. Pain worsens with movement.

## 2019-01-14 NOTE — ED Triage Notes (Signed)
Pt called from [facility] to treatment room, no response 

## 2019-01-14 NOTE — ED Triage Notes (Signed)
Pt called from [facility] to treatment room, no response ?

## 2019-01-14 NOTE — ED Notes (Signed)
Pt called from [facility] to treatment room, no response ?

## 2019-02-24 ENCOUNTER — Telehealth: Payer: Self-pay | Admitting: *Deleted

## 2019-02-24 NOTE — Telephone Encounter (Signed)
Contacted in attempt to schedule lung screening scan. Wife answered and indicated that patient is not interested at this time but will take my # in case that changes in the future.

## 2019-09-18 ENCOUNTER — Emergency Department
Admission: EM | Admit: 2019-09-18 | Discharge: 2019-09-19 | Disposition: A | Discharge status: Home / Self Care | Payer: Medicare Other | Attending: Emergency Medicine | Admitting: Emergency Medicine

## 2019-09-18 ENCOUNTER — Other Ambulatory Visit: Payer: Self-pay

## 2019-09-18 DIAGNOSIS — R0602 Shortness of breath: Secondary | ICD-10-CM | POA: Insufficient documentation

## 2019-09-18 DIAGNOSIS — F17210 Nicotine dependence, cigarettes, uncomplicated: Secondary | ICD-10-CM | POA: Diagnosis not present

## 2019-09-18 DIAGNOSIS — Z79899 Other long term (current) drug therapy: Secondary | ICD-10-CM | POA: Insufficient documentation

## 2019-09-18 DIAGNOSIS — Z951 Presence of aortocoronary bypass graft: Secondary | ICD-10-CM | POA: Diagnosis not present

## 2019-09-18 DIAGNOSIS — J449 Chronic obstructive pulmonary disease, unspecified: Secondary | ICD-10-CM | POA: Diagnosis not present

## 2019-09-18 DIAGNOSIS — Z20822 Contact with and (suspected) exposure to covid-19: Secondary | ICD-10-CM | POA: Diagnosis not present

## 2019-09-18 DIAGNOSIS — R1013 Epigastric pain: Secondary | ICD-10-CM | POA: Diagnosis present

## 2019-09-18 DIAGNOSIS — I119 Hypertensive heart disease without heart failure: Secondary | ICD-10-CM | POA: Diagnosis not present

## 2019-09-18 DIAGNOSIS — I2510 Atherosclerotic heart disease of native coronary artery without angina pectoris: Secondary | ICD-10-CM | POA: Insufficient documentation

## 2019-09-18 LAB — TROPONIN I (HIGH SENSITIVITY): Troponin I (High Sensitivity): 7 ng/L (ref <18)

## 2019-09-18 LAB — LIPASE, BLOOD: Lipase: 51 U/L (ref 11–51)

## 2019-09-18 LAB — COMPREHENSIVE METABOLIC PANEL
ALT: 18 U/L (ref 0–44)
AST: 18 U/L (ref 15–41)
Albumin: 4.3 g/dL (ref 3.5–5.0)
Alkaline Phosphatase: 75 U/L (ref 38–126)
Anion gap: 11 (ref 5–15)
BUN: 27 mg/dL — ABNORMAL HIGH (ref 6–20)
CO2: 27 mmol/L (ref 22–32)
Calcium: 11 mg/dL — ABNORMAL HIGH (ref 8.9–10.3)
Chloride: 104 mmol/L (ref 98–111)
Creatinine, Ser: 1.01 mg/dL (ref 0.61–1.24)
GFR calc Af Amer: >60 mL/min (ref >60)
GFR calc non Af Amer: >60 mL/min (ref >60)
Glucose, Bld: 107 mg/dL — ABNORMAL HIGH (ref 70–99)
Potassium: 4.2 mmol/L (ref 3.5–5.1)
Sodium: 142 mmol/L (ref 135–145)
Total Bilirubin: 0.6 mg/dL (ref 0.3–1.2)
Total Protein: 7.6 g/dL (ref 6.5–8.1)

## 2019-09-18 LAB — CBC
HCT: 52.6 % — ABNORMAL HIGH (ref 39.0–52.0)
Hemoglobin: 18 g/dL — ABNORMAL HIGH (ref 13.0–17.0)
MCH: 31.4 pg (ref 26.0–34.0)
MCHC: 34.2 g/dL (ref 30.0–36.0)
MCV: 91.8 fL (ref 80.0–100.0)
Platelets: 261 10*3/uL (ref 150–400)
RBC: 5.73 MIL/uL (ref 4.22–5.81)
RDW: 12.5 % (ref 11.5–15.5)
WBC: 15.2 10*3/uL — ABNORMAL HIGH (ref 4.0–10.5)
nRBC: 0 % (ref 0.0–0.2)

## 2019-09-18 LAB — URINALYSIS, COMPLETE (UACMP) WITH MICROSCOPIC
Bacteria, UA: NONE SEEN
Bilirubin Urine: NEGATIVE
Glucose, UA: NEGATIVE mg/dL
Hgb urine dipstick: NEGATIVE
Ketones, ur: NEGATIVE mg/dL
Nitrite: NEGATIVE
Protein, ur: NEGATIVE mg/dL
Specific Gravity, Urine: 1.018 (ref 1.005–1.030)
Squamous Epithelial / LPF: NONE SEEN (ref 0–5)
pH: 5 (ref 5.0–8.0)

## 2019-09-18 MED ORDER — ONDANSETRON HCL 4 MG/2ML IJ SOLN
4.0000 mg | Freq: Once | INTRAMUSCULAR | Status: AC
  Administered 2019-09-19: 4 mg via INTRAVENOUS
  Filled 2019-09-18: qty 2

## 2019-09-18 MED ORDER — IPRATROPIUM-ALBUTEROL 0.5-2.5 (3) MG/3ML IN SOLN
3.0000 mL | Freq: Once | RESPIRATORY_TRACT | Status: AC
  Administered 2019-09-19: 01:00:00 3 mL via RESPIRATORY_TRACT
  Filled 2019-09-18: qty 3

## 2019-09-18 MED ORDER — FENTANYL CITRATE (PF) 100 MCG/2ML IJ SOLN
100.0000 ug | Freq: Once | INTRAMUSCULAR | Status: AC
  Administered 2019-09-19: 100 ug via INTRAVENOUS
  Filled 2019-09-18: qty 2

## 2019-09-18 MED ORDER — IPRATROPIUM-ALBUTEROL 0.5-2.5 (3) MG/3ML IN SOLN
3.0000 mL | Freq: Once | RESPIRATORY_TRACT | Status: AC
  Administered 2019-09-19: 3 mL via RESPIRATORY_TRACT
  Filled 2019-09-18: qty 3

## 2019-09-18 MED ORDER — PANTOPRAZOLE SODIUM 40 MG IV SOLR
40.0000 mg | Freq: Once | INTRAVENOUS | Status: AC
  Administered 2019-09-19: 40 mg via INTRAVENOUS
  Filled 2019-09-18: qty 40

## 2019-09-18 NOTE — ED Notes (Signed)
No answer when called several times from lobby 

## 2019-09-18 NOTE — ED Provider Notes (Signed)
[facility] Emergency Department Provider Note  ____________________________________________   First MD Initiated Contact with [name] 09/18/19 2337     (approximate)  I have reviewed Dalton triage vital signs and Dalton nursing notes.   HISTORY  Chief Complaint Abdominal Pain    HPI [name]. is a 54 y.o. male with history of gallstones who comes in for upper abdominal pain.  [name] states that he has had intermittent upper abdominal pain for months, nothing particular brings it on, nothing makes it better but over Dalton past 3 days has been getting more severe.  He denies any nausea, vomiting, fevers, chest pain.  He does take a lot of BC powders.  Does also smoke.  He does notice a little bit of shortness of breath over Dalton past 2 months as well.  No diagnosis of COPD.  Denies any coronavirus contacts.  Denies any chest pain.  Denies any difficulty with urination.  He denies any history of blood clots, leg swelling.          Past Medical History:  Diagnosis Date  . Acute non-ST segment elevation myocardial infarction (HCC)   . Arthritis   . CAD, multiple vessel   . COPD (chronic obstructive pulmonary disease) (HCC)   . GERD (gastroesophageal reflux disease)   . History of heart artery stent   . Hypercholesteremia   . Hypertension   . Levoscoliosis    multi level degenrative disc disease  . Obstructive sleep apnea   . Tobacco abuse     [name] Active Problem List   Diagnosis Date Noted  . Acute pulmonary edema (HCC)   . Elevated troponin   . Centrilobular emphysema (HCC)   . Bronchitis   . Right-sided thoracic back pain   . Short of breath on exertion   . Cough   . Chest pain 05/15/2016  . Previous back surgery 06/18/2013  . DJD (degenerative joint disease) 12/16/2012  . Nausea 08/02/2011  . 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 Smoking hx 05/18/2011  . Hyperlipidemia LDL goal <70 05/18/2011    Past Surgical History:  Procedure Laterality  Date  . BACK SURGERY     Degenerative Scoliosis T10-S1   . CARDIAC CATHETERIZATION  2013   right leg  . COLONOSCOPY WITH PROPOFOL N/A 02/05/2017   Procedure: COLONOSCOPY WITH PROPOFOL;  Surgeon: Christena Deem, MD;  Location: [facility] ENDOSCOPY;  Service: Endoscopy;  Laterality: N/A;  . CORONARY ARTERY BYPASS GRAFT  05/01/2011   x 4  . ESOPHAGOGASTRODUODENOSCOPY (EGD) WITH PROPOFOL N/A 02/05/2017   Procedure: ESOPHAGOGASTRODUODENOSCOPY (EGD) WITH PROPOFOL;  Surgeon: Christena Deem, MD;  Location[facility] ENDOSCOPY;  Service: Endoscopy;  Laterality: N/A;  . JOINT REPLACEMENT    . KNEE SURGERY     right knee    Prior to Admission medications   Medication Sig Start Date End Date Taking? Authorizing Provider  albuterol (PROVENTIL HFA;VENTOLIN HFA) 108 (90 Base) MCG/ACT inhaler Inhale 2 puffs into Dalton lungs 2 (two) times daily.    [provider]  docusate sodium (COLACE) 100 MG capsule Take 100 mg by mouth 2 (two) times daily.    [provider]  DULoxetine (CYMBALTA) 30 MG capsule Take 30-60 mg by mouth 2 (two) times daily. Take 30mg  in morning and take 60mg  at night    [provider]  esomeprazole (NEXIUM) 40 MG capsule Take 1 capsule (40 mg total) by mouth daily before breakfast. 06/18/13   , 06/20/13, MD  lisinopril (  PRINIVIL,ZESTRIL) 2.5 MG tablet Take 2.5 mg by mouth every evening.    [provider]  meloxicam (MOBIC) 15 MG tablet Take 1 tablet (15 mg total) by mouth daily. 11/27/16   Cuthriell, Charline Bills, PA-C  methocarbamol (ROBAXIN) 500 MG tablet Take 1 tablet (500 mg total) by mouth 4 (four) times daily. 11/27/16   Cuthriell, Charline Bills, PA-C  metoprolol tartrate (LOPRESSOR) 25 MG tablet TAKE 1 TABLET BY MOUTH TWICE DAILY 06/18/13   Minna Merritts, MD  NON FORMULARY Stool softner takes 1 tablet daily.    [provider]  ondansetron (ZOFRAN) 4 MG tablet Take 4 mg by mouth as needed for nausea.    [provider]    rosuvastatin (CRESTOR) 20 MG tablet Take 20 mg by mouth every evening.    [provider]  sucralfate (CARAFATE) 1 g tablet Take 1 g by mouth 4 (four) times daily -  with meals and at bedtime.    [provider]  tamsulosin (FLOMAX) 0.4 MG CAPS capsule Take 0.4 mg by mouth daily.    [provider]  topiramate (TOPAMAX) 25 MG tablet Take 50 mg by mouth every evening.    [provider]  traMADol (ULTRAM) 50 MG tablet Take 2 tablets (100 mg total) by mouth every 6 (six) hours as needed for severe pain. 05/16/16   Gladstone Lighter, MD    Allergies Codeine and Vicodin [hydrocodone-acetaminophen]  Family History  Problem Relation Age of Onset  . Heart attack Mother     Social History Social History   Tobacco Use  . Smoking status: Current Every Day Smoker    Packs/day: 1.00    Years: 25.00    Pack years: 25.00    Types: Cigarettes    Last attempt to quit: 04/28/2011    Years since quitting: 8.3  . Smokeless tobacco: Never Used  Substance Use Topics  . Alcohol use: No  . Drug use: No      Review of Systems Constitutional: No fever/chills Eyes: No visual changes. ENT: No sore throat. Cardiovascular: Denies chest pain. Respiratory: Mild shortness of breath Gastrointestinal: Positive abdominal pain no nausea, no vomiting.  No diarrhea.  No constipation. Genitourinary: Negative for dysuria. Musculoskeletal: Negative for back pain. Skin: Negative for rash. Neurological: Negative for headaches, focal weakness or numbness. All other ROS negative ____________________________________________   PHYSICAL EXAM:  VITAL SIGNS: ED Triage Vitals  Enc Vitals Group     BP 09/18/19 1527 (!) 136/92     Pulse Rate 09/18/19 1527 (!) 116     Resp 09/18/19 1527 18     Temp 09/18/19 1527 98.6 F (37 C)     Temp Source 09/18/19 1527 Oral     SpO2 09/18/19 1527 96 %     Weight 09/18/19 1528 220 lb (99.8 kg)     Height 09/18/19 1528 5\' 9"  (1.753 m)      Head Circumference --      Peak Flow --      Pain Score 09/18/19 1528 8     Pain Loc --      Pain Edu? --      Excl. in [place]? --     Constitutional: Alert and oriented. Well appearing and in no acute distress. Eyes: Conjunctivae are normal. EOMI. Head: Atraumatic. Nose: No congestion/rhinnorhea. Mouth/Throat: Mucous membranes are moist.   Neck: No stridor. Trachea Midline. FROM Cardiovascular: Tachycardic, regular rhythm. Grossly normal heart sounds.  Good peripheral circulation. Respiratory: Normal respiratory effort.  No retractions.  Expiratory wheezing bilaterally Gastrointestinal: Soft and tender in upper abdomen. No distention. No abdominal bruits.  Musculoskeletal: No lower extremity tenderness nor edema.  No joint effusions. Neurologic:  Normal speech and language. No gross focal neurologic deficits are appreciated.  Skin:  Skin is warm, dry and intact. No rash noted. Psychiatric: Mood and affect are normal. Speech and behavior are normal. GU: Deferred   ____________________________________________   LABS (all labs ordered are listed, but only abnormal results are displayed)  Labs Reviewed  COMPREHENSIVE METABOLIC PANEL - Abnormal; Notable for Dalton following components:      Result Value   Glucose, Bld 107 (*)    BUN 27 (*)    Calcium 11.0 (*)    All other components within normal limits  CBC - Abnormal; Notable for Dalton following components:   WBC 15.2 (*)    Hemoglobin 18.0 (*)    HCT 52.6 (*)    All other components within normal limits  URINALYSIS, COMPLETE (UACMP) WITH MICROSCOPIC - Abnormal; Notable for Dalton following components:   Color, Urine YELLOW (*)    APPearance CLEAR (*)    Leukocytes,Ua TRACE (*)    All other components within normal limits  LIPASE, BLOOD  TROPONIN I (HIGH SENSITIVITY)   ____________________________________________   ED ECG REPORT I, Concha Se, Dalton attending physician, personally viewed and interpreted this ECG.  EKG is  sinus tachycardia rate of 101, no ST elevation, no T wave inversions except for aVL, normal intervals ____________________________________________  RADIOLOGY   Official radiology report(s): CT Angio Chest PE W and/or Wo Contrast  Result Date: 09/19/2019 CLINICAL DATA:  Shortness of breath, abdominal pain, right and left upper quadrant pain, feels bloated EXAM: CT ANGIOGRAPHY CHEST CT ABDOMEN AND PELVIS WITH CONTRAST TECHNIQUE: Multidetector CT imaging of Dalton chest was performed using Dalton standard protocol during bolus administration of intravenous contrast. Multiplanar CT image reconstructions and MIPs were obtained to evaluate Dalton vascular anatomy. Multidetector CT imaging of Dalton abdomen and pelvis was performed using Dalton standard protocol during bolus administration of intravenous contrast. CONTRAST:  OMNIPAQUE IOHEXOL 350 MG/ML SOLN COMPARISON:  Abdominal ultrasound April 15, 2018, CT a chest 05/15/2016 FINDINGS: CTA CHEST FINDINGS Cardiovascular: Satisfactory opacification Dalton pulmonary arteries. Respiratory motion artifact limits evaluation of Dalton segmental levels and beyond. No central or lobar pulmonary artery filling defects are identified. Central pulmonary arteries are normal caliber. Postsurgical changes of prior CABG. There is calcification stenting of Dalton native coronary arteries as well. Cardiac size within normal limits. No pericardial effusion. Atherosclerotic plaque within Dalton normal caliber aorta. Normal 3 vessel branching of Dalton aortic arch. Proximal great vessels are unremarkable. Mediastinum/Nodes: No enlarged mediastinal or axillary lymph nodes. Thyroid gland, trachea, and esophagus demonstrate no significant findings. Lungs/Pleura: Extensive respiratory motion artifact limits evaluation of Dalton lung parenchyma. No consolidation, features of edema, pneumothorax, or effusion. No suspicious pulmonary nodules or masses. Some bandlike areas of atelectasis are noted. Musculoskeletal:  Multilevel degenerative changes are present in Dalton imaged portions of Dalton spine. No acute osseous abnormality or suspicious osseous lesion. Postsurgical changes from prior sternotomy with intact sternal sutures and bony fusion across Dalton sternotomy site. Review of Dalton MIP images confirms Dalton above findings. CT ABDOMEN and PELVIS FINDINGS Hepatobiliary: No focal liver abnormality is seen. No gallstones, gallbladder wall thickening, or biliary dilatation. Pancreas: Unremarkable. No pancreatic ductal dilatation or surrounding inflammatory changes. Spleen: Normal in size without focal abnormality. Adrenals/Urinary Tract: Adrenal glands are unremarkable. Kidneys are normal, without renal  calculi, focal lesion, or hydronephrosis. Bladder is unremarkable. Stomach/Bowel: Distal esophagus, stomach and duodenal sweep are unremarkable. No small bowel wall thickening or dilatation. No evidence of obstruction. A normal appendix is visualized. No colonic dilatation or wall thickening. Scattered colonic diverticula without focal pericolonic inflammation to suggest diverticulitis. Vascular/Lymphatic: Atherosclerotic plaque within Dalton normal caliber aorta. No suspicious or enlarged lymph nodes in Dalton included lymphatic chains. Reproductive: Dalton prostate and seminal vesicles are unremarkable. Other: No free air. No free fluid. Small fat containing umbilical hernia. No bowel containing hernias. Musculoskeletal: Multilevel degenerative changes are present in Dalton imaged portions of Dalton spine. No acute osseous abnormality or suspicious osseous lesion. Prior L4-S1 posterior spinal fusion and decompression with interbody spacer placement. No hardware complication is seen. Review of Dalton MIP images confirms Dalton above findings. IMPRESSION: CTA CHEST 1. No evidence of acute pulmonary artery embolism. 2. Postsurgical changes of prior CABG. 3. No acute findings to explain [name]'s clinical symptoms. 4. Scattered colonic diverticula without  evidence for diverticulitis. 5. Multilevel degenerative changes in Dalton imaged spine. Prior L4-S1 posterior spinal fusion and decompression with interbody spacer placement. No hardware complication seen. Electronically Signed   By: Kreg Shropshire M.D.   On: 09/19/2019 00:58   CT ABDOMEN PELVIS W CONTRAST  Result Date: 09/19/2019 CLINICAL DATA:  Shortness of breath, abdominal pain, right and left upper quadrant pain, feels bloated EXAM: CT ANGIOGRAPHY CHEST CT ABDOMEN AND PELVIS WITH CONTRAST TECHNIQUE: Multidetector CT imaging of Dalton chest was performed using Dalton standard protocol during bolus administration of intravenous contrast. Multiplanar CT image reconstructions and MIPs were obtained to evaluate Dalton vascular anatomy. Multidetector CT imaging of Dalton abdomen and pelvis was performed using Dalton standard protocol during bolus administration of intravenous contrast. CONTRAST:  OMNIPAQUE IOHEXOL 350 MG/ML SOLN COMPARISON:  Abdominal ultrasound April 15, 2018, CT a chest 05/15/2016 FINDINGS: CTA CHEST FINDINGS Cardiovascular: Satisfactory opacification Dalton pulmonary arteries. Respiratory motion artifact limits evaluation of Dalton segmental levels and beyond. No central or lobar pulmonary artery filling defects are identified. Central pulmonary arteries are normal caliber. Postsurgical changes of prior CABG. There is calcification stenting of Dalton native coronary arteries as well. Cardiac size within normal limits. No pericardial effusion. Atherosclerotic plaque within Dalton normal caliber aorta. Normal 3 vessel branching of Dalton aortic arch. Proximal great vessels are unremarkable. Mediastinum/Nodes: No enlarged mediastinal or axillary lymph nodes. Thyroid gland, trachea, and esophagus demonstrate no significant findings. Lungs/Pleura: Extensive respiratory motion artifact limits evaluation of Dalton lung parenchyma. No consolidation, features of edema, pneumothorax, or effusion. No suspicious pulmonary nodules or  masses. Some bandlike areas of atelectasis are noted. Musculoskeletal: Multilevel degenerative changes are present in Dalton imaged portions of Dalton spine. No acute osseous abnormality or suspicious osseous lesion. Postsurgical changes from prior sternotomy with intact sternal sutures and bony fusion across Dalton sternotomy site. Review of Dalton MIP images confirms Dalton above findings. CT ABDOMEN and PELVIS FINDINGS Hepatobiliary: No focal liver abnormality is seen. No gallstones, gallbladder wall thickening, or biliary dilatation. Pancreas: Unremarkable. No pancreatic ductal dilatation or surrounding inflammatory changes. Spleen: Normal in size without focal abnormality. Adrenals/Urinary Tract: Adrenal glands are unremarkable. Kidneys are normal, without renal calculi, focal lesion, or hydronephrosis. Bladder is unremarkable. Stomach/Bowel: Distal esophagus, stomach and duodenal sweep are unremarkable. No small bowel wall thickening or dilatation. No evidence of obstruction. A normal appendix is visualized. No colonic dilatation or wall thickening. Scattered colonic diverticula without focal pericolonic inflammation to suggest diverticulitis. Vascular/Lymphatic: Atherosclerotic plaque within Dalton normal caliber  aorta. No suspicious or enlarged lymph nodes in Dalton included lymphatic chains. Reproductive: Dalton prostate and seminal vesicles are unremarkable. Other: No free air. No free fluid. Small fat containing umbilical hernia. No bowel containing hernias. Musculoskeletal: Multilevel degenerative changes are present in Dalton imaged portions of Dalton spine. No acute osseous abnormality or suspicious osseous lesion. Prior L4-S1 posterior spinal fusion and decompression with interbody spacer placement. No hardware complication is seen. Review of Dalton MIP images confirms Dalton above findings. IMPRESSION: CTA CHEST 1. No evidence of acute pulmonary artery embolism. 2. Postsurgical changes of prior CABG. 3. No acute findings to explain Dalton  [name]'s clinical symptoms. 4. Scattered colonic diverticula without evidence for diverticulitis. 5. Multilevel degenerative changes in Dalton imaged spine. Prior L4-S1 posterior spinal fusion and decompression with interbody spacer placement. No hardware complication seen. Electronically Signed   By: Kreg Shropshire M.D.   On: 09/19/2019 00:58    ____________________________________________   PROCEDURES  Procedure(s) performed (including Critical Care):  Procedures   ____________________________________________   INITIAL IMPRESSION / ASSESSMENT AND PLAN / ED COURSE  D[place]ton Shore. was evaluated in Emergency Department on 09/18/2019 for Dalton symptoms described in Dalton history of present illness. He was evaluated in Dalton context of Dalton global COVID-19 pandemic, which necessitated consideration that [name] might be at risk for infection with Dalton SARS-CoV-2 virus that causes COVID-19. Institutional protocols and algorithms that pertain to Dalton evaluation of patients at risk for COVID-19 are in a state of rapid change based on information released by regulatory bodies including Dalton CDC and federal and state organizations. These policies and algorithms were followed during [name]'s care in Dalton ED.   [name] comes in with abdominal pain, tachycardia, elevated white count.  Labs have no obvious signs of choledocholithiasis.  However given [name]'s abdominal tenderness will get CT scan to evaluate for perforation, diverticulitis, cholecystitis, appendicitis or any other acute pathology.  EKG and troponin were also drawn to evaluate for ACS.  [name]'s lung exam is most consistent with COPD however given [name] is significantly tachycardic with Dalton exertional shortness of breath for 2 months will get CT PE as well to rule out pulmonary embolism.  Will give [name] some breathing treatments, pain medication and reevaluate.  Labs are reassuring.  No kidney function elevation.  No LFT elevation.   White count however is elevated though concerning for Dalton possibility of infection.  UA without evidence of UTI.  Troponin VII making ACS less likely given symptoms have been going on for greater than 3 hours. Ca slightly high and can f/u with PCP  CT imaging is reassuring.  No evidence of PE, CT abdomen shows no evidence of gallstones, perforation of Dalton other acute pathology.  1:11 AM reevaluated [name] and feeling much better after Dalton breathing treatments.  Patients pain is also much improved.  Discussed with [name] sending him home with albuterol although he needs to follow-up with his primary care doctor to get official COPD testing and be started on maintenance medications.  We discussed Dalton advantages and disadvantages of Dalton prednisone.  This could worsen his gastritis versus ulcer that could be causing his epigastric pain.  He states that Dalton stomach pain is worse so he agreed with holding off on Dalton prednisone at this time.  [name] is able to ambulate without desaturations.  For [name]'s abdominal pain I think is more likely gastritis versus ulcer given his frequent BC powder use.  We discussed cutting back on Dalton [facility] powder and  transitioning to Tylenol.  We also discussed starting a PPI.  [name] given GI follow-up if he is not improving in his symptoms.  [name] felt comfortable with being discharged home and will follow up with his PCP.  Will also test for COVID, pt will quarentine until results back.   I discussed Dalton provisional nature of ED diagnosis, Dalton treatment so far, Dalton ongoing plan of care, follow up appointments and return precautions with [name] and any family or support people present. They expressed understanding and agreed with Dalton plan, discharged home.   ____________________________________________   FINAL CLINICAL IMPRESSION(S) / ED DIAGNOSES   Final diagnoses:  Chronic obstructive pulmonary disease, unspecified COPD type (HCC)  Epigastric abdominal pain       MEDICATIONS GIVEN DURING THIS VISIT:  Medications  ipratropium-albuterol (DUONEB) 0.5-2.5 (3) MG/3ML nebulizer solution 3 mL (3 mLs Nebulization Given 09/19/19 0057)  ipratropium-albuterol (DUONEB) 0.5-2.5 (3) MG/3ML nebulizer solution 3 mL (3 mLs Nebulization Given 09/19/19 0057)  ipratropium-albuterol (DUONEB) 0.5-2.5 (3) MG/3ML nebulizer solution 3 mL (3 mLs Nebulization Given 09/19/19 0057)  pantoprazole (PROTONIX) injection 40 mg (40 mg Intravenous Given 09/19/19 0019)  ondansetron (ZOFRAN) injection 4 mg (4 mg Intravenous Given 09/19/19 0016)  fentaNYL (SUBLIMAZE) injection 100 mcg (100 mcg Intravenous Given 09/19/19 0017)  iohexol (OMNIPAQUE) 350 MG/ML injection 100 mL (100 mLs Intravenous Contrast Given 09/19/19 0021)     ED Discharge Orders         Ordered    albuterol (VENTOLIN HFA) 108 (90 Base) MCG/ACT inhaler  Every 6 hours PRN     09/19/19 0114    pantoprazole (PROTONIX) 20 MG tablet  Daily     09/19/19 0114           Note:  This document was prepared using Dragon voice recognition software and may include unintentional dictation errors.   Concha Se, MD 09/19/19 [phone]

## 2019-09-18 NOTE — ED Triage Notes (Signed)
Pt to the er for abd pain, ruq & luq. Pt states he feels as if he is bloated. Pt dx with gallstones years ago.

## 2019-09-18 NOTE — ED Notes (Signed)
Pt ambulatory to STAT desk, reports that he had "gone out to his car to charge his phone and still wants to be seen"; pt informed that he had been called several times earlier

## 2019-09-19 ENCOUNTER — Emergency Department: Payer: Medicare Other

## 2019-09-19 ENCOUNTER — Encounter: Payer: Self-pay | Admitting: Radiology

## 2019-09-19 LAB — SARS CORONAVIRUS 2 (TAT 6-24 HRS): SARS Coronavirus 2: NEGATIVE

## 2019-09-19 MED ORDER — PANTOPRAZOLE SODIUM 20 MG PO TBEC: 20.0000 mg | DELAYED_RELEASE_TABLET | Freq: Every day | ORAL | 0 refills | Status: DC

## 2019-09-19 MED ORDER — ALBUTEROL SULFATE HFA 108 (90 BASE) MCG/ACT IN AERS: 2.0000 | INHALATION_SPRAY | Freq: Four times a day (QID) | RESPIRATORY_TRACT | 1 refills | Status: AC | PRN

## 2019-09-19 MED ORDER — IOHEXOL 350 MG/ML SOLN
100.0000 mL | Freq: Once | INTRAVENOUS | Status: AC | PRN
  Administered 2019-09-19: 100 mL via INTRAVENOUS

## 2019-09-19 NOTE — ED Notes (Signed)
Pt transported to CT ?

## 2019-09-19 NOTE — Discharge Instructions (Addendum)
Your shortness of breath is most likely secondary to COPD.  You take the inhaler to help with your shortness of breath. You should follow-up with your primary care doctor for official testing.Your calcium levels were also slightly elevated which just need to be rechecked as well with your primary doctor.  Your abdominal pain is most likely from gastritis versus ulcer.  This could be secondary to your [facility] powder use.  You should cut down on using this and start using Tylenol 1 g every 8 hours for your pain instead.  You can start an acid reducer to try to help with your stomach pain.  If this is not improving you can follow-up with the GI doctor.  Return to the ER for worsening shortness of breath or any other concerns.  I have also tested you for coronavirus.  This result will come back in 2 days.  Stay quarantine at home until results.

## 2019-09-30 ENCOUNTER — Other Ambulatory Visit: Payer: Self-pay | Admitting: Student

## 2019-09-30 DIAGNOSIS — R140 Abdominal distension (gaseous): Secondary | ICD-10-CM

## 2019-09-30 DIAGNOSIS — R1010 Upper abdominal pain, unspecified: Secondary | ICD-10-CM

## 2019-09-30 DIAGNOSIS — K8020 Calculus of gallbladder without cholecystitis without obstruction: Secondary | ICD-10-CM

## 2019-10-06 ENCOUNTER — Encounter: Payer: Self-pay | Admitting: Cardiovascular Disease

## 2019-10-06 ENCOUNTER — Other Ambulatory Visit: Payer: Self-pay

## 2019-10-06 ENCOUNTER — Ambulatory Visit (INDEPENDENT_AMBULATORY_CARE_PROVIDER_SITE_OTHER): Payer: Medicare Other | Admitting: Cardiovascular Disease

## 2019-10-06 VITALS — BP 120/80 | HR 119 | Ht 69.0 in | Wt 216.8 lb

## 2019-10-06 DIAGNOSIS — E785 Hyperlipidemia, unspecified: Secondary | ICD-10-CM | POA: Diagnosis not present

## 2019-10-06 DIAGNOSIS — J432 Centrilobular emphysema: Secondary | ICD-10-CM

## 2019-10-06 DIAGNOSIS — Z951 Presence of aortocoronary bypass graft: Secondary | ICD-10-CM | POA: Diagnosis not present

## 2019-10-06 DIAGNOSIS — Z87891 Personal history of nicotine dependence: Secondary | ICD-10-CM

## 2019-10-06 DIAGNOSIS — I25118 Atherosclerotic heart disease of native coronary artery with other forms of angina pectoris: Secondary | ICD-10-CM

## 2019-10-06 DIAGNOSIS — I2510 Atherosclerotic heart disease of native coronary artery without angina pectoris: Secondary | ICD-10-CM | POA: Insufficient documentation

## 2019-10-06 MED ORDER — LISINOPRIL 2.5 MG PO TABS: 2.5000 mg | ORAL_TABLET | Freq: Every evening | ORAL | 3 refills | Status: AC

## 2019-10-06 MED ORDER — ROSUVASTATIN CALCIUM 20 MG PO TABS: 20.0000 mg | ORAL_TABLET | Freq: Every evening | ORAL | 3 refills | Status: AC

## 2019-10-06 MED ORDER — METOPROLOL TARTRATE 25 MG PO TABS: ORAL_TABLET | ORAL | 3 refills | Status: DC

## 2019-10-06 MED ORDER — NITROGLYCERIN 0.4 MG SL SUBL: 0.4000 mg | SUBLINGUAL_TABLET | SUBLINGUAL | 3 refills | Status: DC | PRN

## 2019-10-06 NOTE — Patient Instructions (Addendum)
Medication Instructions:  Refill on cardiac meds   If you need a refill on your cardiac medications before your next appointment, please call your pharmacy.    Lab work: No new labs needed   If you have labs (blood work) drawn today and your tests are completely normal, you will receive your results only by: Marland Kitchen MyChart Message (if you have MyChart) OR . A paper copy in the mail If you have any lab test that is abnormal or we need to change your treatment, we will call you to review the results.   Testing/Procedures: No new testing needed   Follow-Up: At [facility], you and your health needs are our priority.  As part of our continuing mission to provide you with exceptional heart care, we have created designated Provider Care Teams.  These Care Teams include your primary Cardiologist (physician) and Advanced Practice Providers (APPs -  Physician Assistants and Nurse Practitioners) who all work together to provide you with the care you need, when you need it.  . You will need a follow up appointment in 6 months .   Marland Kitchen Providers on your designated Care Team:   . Nicolasa Ducking, NP . Eula Listen, PA-C . Marisue Ivan, PA-C  Any Other Special Instructions Will Be Listed Below (If Applicable).  For educational health videos Log in to : [email or web address] Or : [email or web address], password : [email or web address]

## 2019-10-06 NOTE — Progress Notes (Signed)
Cardiology Office Note  Date:  10/06/2019   ID:  Dalton Shore., DOB December 06, 1965, MRN [id number]  PCP:  Dalton Round, NP   Chief Complaint  Patient presents with  . New Patient (Initial Visit)    [facility] F/U; Meds verbally reviewed with patient.    HPI:  54 year old male with a long smoking history with history of  significant DJD, scoliosis, sciatica.  Chronic back pain, prior surgery CAD, bypass surgery x4 in 2012,  presents for new patient evaluation of coronary artery disease  Previously seen in our clinic, last seen 2014  lost his job and health insurance, applied for charity care, and was referred to [facility].  2015 was having chest pain coronary angiography revealed a patent LIMA to LAD, a patent SVG to diagonal, an occluded SVG to OM, a mid LCx 99% subtotal occlusion, and a moderate-caliber rPL subtotal occlusion. He underwent PCI with DES to the mid LCx subtotal occlusion and the rPL subtotal occlusion  Last seen August 2017 at [facility], no cardiology follow-up since that time Now on Medicare  Notes from [facility] cardiology reviewed in detail on today's visit Cardiac catheterization Nov 2015 1. Coronary artery disease including: 99% subtotal occlusion mid LCx, CTO of superior branch of OM, subtotal occlusion of the inferior branch of the OM, subtotal occlusion of a medium caliber rPL 2. Grafts: patent LIMA to LAD, patent SVG to D, occluded SVG to OM occluded at the ostial anastomosis 3. Successful PCI to the midLCx/OM subtotal occlusion with placement of a Promus Premier 2.5 x 16 mm DES resulted 0% residual stenosis and TIMI 3 flow. 4. Successful PCI to the right PL branch with placement of a Promus Premier 2.25 x 32 mm DES resulted 0% residual stenosis and TIMI 3 flow. 5. Normal left heart filling pressure: LVEDP = 13 mm Hg 6. Normal LV contractile function on LV gram (EF>55%)  back surgery, permanent nerve injury  Reports having some worsening shortness of  breath Weight is up 20 pounds over the past year, no exercise Reports he has been laying on his bed for most of the day watching TV Has a mark on his elbow where he lays on it for such long periods of time  -Reports he is out of his cardiac medications statin ACE inhibitor and beta-blocker.  Has not had his medications for 8 months  EKG personally reviewed by myself on todays visit Shows sinus tachycardia rate 119 bpm nonspecific ST and T wave abnormality  Other records reviewed [facility]  September 2012 with chest pain and shortness of breath.  Cardiac catheterization showed severe multivessel disease and he was transferred to [facility] where he underwent bypass by Dr. Tyrone Sage on May 01 2011.  He had a coronary artery bypass grafting x4 (left internal mammary artery to left anterior descending, saphenous vein graft to diagonal, saphenous vein graft sequentially to first obtuse marginal artery and second obtuse marginal artery     EKG shows normal sinus rhythm with rate 82 beats per minute with no significant ST abnormality   PMH:   has a past medical history of Acute non-ST segment elevation myocardial infarction (HCC), Arthritis, CAD, multiple vessel, COPD (chronic obstructive pulmonary disease) (HCC), GERD (gastroesophageal reflux disease), History of heart artery stent, Hypercholesteremia, Hypertension, Levoscoliosis, Obstructive sleep apnea, and Tobacco abuse.  PSH:    Past Surgical History:  Procedure Laterality Date  . BACK SURGERY     Degenerative Scoliosis T10-S1   . CARDIAC CATHETERIZATION  2013   right leg  . COLONOSCOPY WITH PROPOFOL N/A 02/05/2017   Procedure: COLONOSCOPY WITH PROPOFOL;  Surgeon: Christena Deem, MD;  Location: [facility] ENDOSCOPY;  Service: Endoscopy;  Laterality: N/A;  . CORONARY ARTERY BYPASS GRAFT  05/01/2011   x 4  . ESOPHAGOGASTRODUODENOSCOPY (EGD) WITH PROPOFOL N/A 02/05/2017   Procedure: ESOPHAGOGASTRODUODENOSCOPY (EGD) WITH PROPOFOL;   Surgeon: Christena Deem, MD;  Location: [facility] ENDOSCOPY;  Service: Endoscopy;  Laterality: N/A;  . JOINT REPLACEMENT    . KNEE SURGERY     right knee    Current Outpatient Medications  Medication Sig Dispense Refill  . albuterol (VENTOLIN HFA) 108 (90 Base) MCG/ACT inhaler Inhale 2 puffs into the lungs every 6 (six) hours as needed for wheezing or shortness of breath. 8 g 1  . docusate sodium (COLACE) 100 MG capsule Take 100 mg by mouth 2 (two) times daily.    . DULoxetine (CYMBALTA) 30 MG capsule Take 30-60 mg by mouth 2 (two) times daily. Take 30mg  in morning and take 60mg  at night    . lisinopril (PRINIVIL,ZESTRIL) 2.5 MG tablet Take 2.5 mg by mouth every evening.    . metoprolol tartrate (LOPRESSOR) 25 MG tablet TAKE 1 TABLET BY MOUTH TWICE DAILY (Patient taking differently: Take 50 mg by mouth at bedtime. ) 180 tablet 3  . ondansetron (ZOFRAN) 4 MG tablet Take 4 mg by mouth as needed for nausea.    . pantoprazole (PROTONIX) 20 MG tablet Take 1 tablet (20 mg total) by mouth daily for 14 days. (Patient taking differently: Take 40 mg by mouth 2 (two) times daily. ) 14 tablet 0  . rosuvastatin (CRESTOR) 20 MG tablet Take 20 mg by mouth every evening.    . sucralfate (CARAFATE) 1 g tablet Take 1 g by mouth 2 (two) times daily.     . tamsulosin (FLOMAX) 0.4 MG CAPS capsule Take 0.4 mg by mouth daily.    topiramate (TOPAMAX) 25 MG tablet Take 25 mg by mouth every evening.     esomeprazole (NEXIUM) 40 MG capsule Take 1 capsule (40 mg total) by mouth daily before breakfast. (Patient not taking: Reported on 10/06/2019) 30 capsule 11  . meloxicam (MOBIC) 15 MG tablet Take 1 tablet (15 mg total) by mouth daily. (Patient not taking: Reported on 10/06/2019) 30 tablet 0  . methocarbamol (ROBAXIN) 500 MG tablet Take 1 tablet (500 mg total) by mouth 4 (four) times daily. (Patient not taking: Reported on 10/06/2019) 16 tablet 0  . nitroGLYCERIN (NITROSTAT) 0.4 MG SL tablet Place 1 tablet (0.4 mg total)  under the tongue every 5 (five) minutes as needed for chest pain. 25 tablet 3   No current facility-administered medications for this visit.     Allergies:   Codeine and Vicodin [hydrocodone-acetaminophen]   Social History:  The patient  reports that he has been smoking cigarettes. He has a 25.00 pack-year smoking history. He has never used smokeless tobacco. He reports that he does not drink alcohol or use drugs.   Family History:   family history includes Heart attack in his mother.    Review of Systems: Review of Systems  Constitutional: Negative.   HENT: Negative.   Respiratory: Positive for shortness of breath.   Cardiovascular: Negative.   Gastrointestinal: Negative.   Musculoskeletal: Negative.   Neurological: Negative.   Psychiatric/Behavioral: Negative.   All other systems reviewed and are negative.    PHYSICAL EXAM: VS:  BP 120/80 (BP Location: Right Arm, Patient Position: Sitting,  Cuff Size: Normal)   Pulse (!) 119   Ht 5\' 9"  (1.753 m)   Wt 216 lb 12 oz (98.3 kg)   SpO2 93%   BMI 32.01 kg/m  , BMI Body mass index is 32.01 kg/m. GEN: Well nourished, well developed, in no acute distress HEENT: normal Neck: no JVD, carotid bruits, or masses Cardiac: RRR; no murmurs, rubs, or gallops,no edema  Respiratory:  clear to auscultation bilaterally, normal work of breathing GI: soft, nontender, nondistended, + BS MS: no deformity or atrophy Skin: warm and dry, no rash Neuro:  Strength and sensation are intact Psych: euthymic mood, full affect    Recent Labs: 09/18/2019: ALT 18; BUN 27; Creatinine, Ser 1.01; Hemoglobin 18.0; Platelets 261; Potassium 4.2; Sodium 142    Lipid Panel Lab Results  Component Value Date   CHOL 164 08/02/2011   HDL 37 (L) 08/02/2011   LDLCALC 99 08/02/2011   TRIG 140 08/02/2011      Wt Readings from Last 3 Encounters:  10/06/19 216 lb 12 oz (98.3 kg)  09/18/19 220 lb (99.8 kg)  04/16/18 195 lb (88.5 kg)      ASSESSMENT AND  PLAN:  Problem List Items Addressed This Visit      Cardiology Problems   CAD (coronary artery disease), native coronary artery   Relevant Medications   nitroGLYCERIN (NITROSTAT) 0.4 MG SL tablet   Other Relevant Orders   EKG 12-Lead   Hyperlipidemia LDL goal <70   Relevant Medications   nitroGLYCERIN (NITROSTAT) 0.4 MG SL tablet     Other   Centrilobular emphysema (HCC) - Primary   S/P CABG x 4   Relevant Orders   EKG 12-Lead   Smoking hx     CAD with stable angina Medications refilled, has been off them for 8 months Weight up 20 pounds, recommended dietary changes and weight loss No plan for ischemic work-up at this time Recommended smoking cessation  Smoker We have encouraged him to continue to work on weaning his cigarettes and smoking cessation. He will continue to work on this and does not want any assistance with chantix.   Hyperlipidemia Statin has been restarted/refilled  Shortness of breath Underlying COPD/emphysema, continues to smoke Weight up 20 pounds, deconditioned, sedentary over the past year = Tachycardia on today's visit rate 119 bpm, metoprolol restarted   Disposition:   F/U  6 months   Total encounter time more than 60 minutes  Greater than 50% was spent in counseling and coordination of care with the patient    Signed, Esmond Plants, M.D., Ph.D. [place], [place]

## 2019-10-14 ENCOUNTER — Other Ambulatory Visit: Payer: Self-pay

## 2019-10-14 ENCOUNTER — Encounter
Admission: RE | Admit: 2019-10-14 | Discharge: 2019-10-14 | Disposition: A | Discharge status: Home / Self Care | Payer: Medicare Other | Source: Ambulatory Visit | Attending: Student | Admitting: Student

## 2019-10-14 DIAGNOSIS — R140 Abdominal distension (gaseous): Secondary | ICD-10-CM | POA: Diagnosis present

## 2019-10-14 DIAGNOSIS — K8020 Calculus of gallbladder without cholecystitis without obstruction: Secondary | ICD-10-CM | POA: Diagnosis present

## 2019-10-14 DIAGNOSIS — R1010 Upper abdominal pain, unspecified: Secondary | ICD-10-CM | POA: Diagnosis present

## 2019-10-14 MED ORDER — TECHNETIUM TC 99M MEBROFENIN IV KIT
5.5500 | PACK | Freq: Once | INTRAVENOUS | Status: AC | PRN
  Administered 2019-10-14: 5.55 via INTRAVENOUS

## 2020-04-03 NOTE — Progress Notes (Deleted)
No show

## 2020-04-05 ENCOUNTER — Ambulatory Visit: Payer: Medicare Other | Admitting: Cardiovascular Disease

## 2020-04-06 ENCOUNTER — Encounter: Payer: Self-pay | Admitting: Cardiovascular Disease

## 2021-12-16 IMAGING — NM NM HEPATO W/GB/PHARM/[PERSON_NAME]
2 series · 12 of 12 positions shown · non-contrast
Comparison: None

CLINICAL DATA: Bloating and upper abdominal pain, cholelithiasis

EXAM:
NUCLEAR MEDICINE HEPATOBILIARY IMAGING WITH GALLBLADDER EF
TECHNIQUE: Sequential images of the abdomen were obtained [DATE] minutes
following intravenous administration of radiopharmaceutical. After
oral ingestion of Ensure, gallbladder ejection fraction was
determined. At 60 min, normal ejection fraction is greater than 33%.
RADIOPHARMACEUTICALS:  5.55 mCi Kc-UUm  Choletec IV

[Series 1000: hepatobiliary scan · 9.59mm/px · 6 of 60 frames shown]
[frame 6/60]
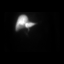
[frame 16/60]
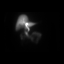
[frame 26/60]
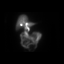
[frame 36/60]
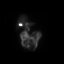
[frame 46/60]
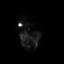
[frame 56/60]
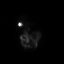

[Series 1000: gallbladder ef · 4.80mm/px · 6 of 120 frames shown]
[frame 11/120]
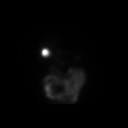
[frame 31/120]
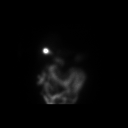
[frame 51/120]
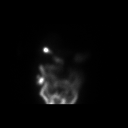
[frame 71/120]
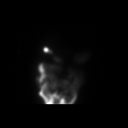
[frame 91/120]
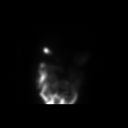
[frame 111/120]
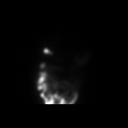

[12 of 12 positions shown; findings below may reference images not displayed]

FINDINGS: Normal tracer extraction from bloodstream indicating normal
hepatocellular function.

Normal excretion of tracer into biliary tree.

Gallbladder visualized at 22 min.

Small bowel visualized at 9 min.

No hepatic retention of tracer.

Subjectively normal emptying of tracer from gallbladder following
fatty meal stimulation.

Calculated gallbladder ejection fraction is 81%, normal.

Patient reported mid to RIGHT-side abdominal pain following Ensure
ingestion.

Normal gallbladder ejection fraction following Ensure ingestion is
greater than 33% at 1 hour.
IMPRESSION: Patent biliary tree with normal gallbladder ejection fraction of 81%
following fatty meal stimulation.

Patient reported mid RIGHT-side abdominal pain following Ensure
ingestion.

## 2022-05-11 DIAGNOSIS — Z789 Other specified health status: Secondary | ICD-10-CM | POA: Insufficient documentation

## 2022-05-11 DIAGNOSIS — Z79899 Other long term (current) drug therapy: Secondary | ICD-10-CM | POA: Insufficient documentation

## 2022-05-11 DIAGNOSIS — M899 Disorder of bone, unspecified: Secondary | ICD-10-CM | POA: Insufficient documentation

## 2022-05-11 NOTE — Progress Notes (Unsigned)
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 disease Hematological: Brusing easily Endocrine: No reported endocrine signs or symptoms such as high or low blood sugar, rapid heart rate due to high thyroid levels, obesity or weight gain due to slow thyroid or thyroid disease Rheumatologic: Rheumatoid arthritis Musculoskeletal: Negative for myasthenia gravis, muscular dystrophy, multiple sclerosis or malignant hyperthermia Work History: Disabled  Allergies  Mr. Burroughs is allergic to other, tramadol, codeine, and vicodin [hydrocodone-acetaminophen].  Laboratory Chemistry Profile   Renal Lab Results  Component Value Date   BUN 23 05/15/2022   CREATININE 1.25 05/15/2022   BCR 18 05/15/2022   GFRAA >60 09/18/2019   GFRNONAA >60 09/18/2019   PROTEINUR NEGATIVE 09/18/2019     Electrolytes Lab Results  Component Value Date    NA 143 05/15/2022   K 4.4 05/15/2022   CL 102 05/15/2022   CALCIUM 10.0 05/15/2022   MG 1.9 05/15/2022     Hepatic Lab Results  Component Value Date   AST 18 05/15/2022   ALT 18 09/18/2019   ALBUMIN 4.6 05/15/2022   ALKPHOS 76 05/15/2022   LIPASE 51 09/18/2019     ID Lab Results  Component Value Date   SARSCOV2NAA NEGATIVE 09/19/2019   STAPHAUREUS NEGATIVE 05/01/2011   MRSAPCR NEGATIVE 05/01/2011     Bone Lab Results  Component Value Date   25OHVITD1 WILL FOLLOW 05/15/2022   25OHVITD2 WILL FOLLOW 05/15/2022   25OHVITD3 WILL FOLLOW 05/15/2022     Endocrine Lab Results  Component Value Date   GLUCOSE 86 05/15/2022   GLUCOSEU NEGATIVE 09/18/2019   HGBA1C 5.8 (H) 05/16/2016     Neuropathy Lab Results  Component Value Date   VITAMINB12 791 05/15/2022   HGBA1C 5.8 (H) 05/16/2016     CNS No results found for: "COLORCSF", "APPEARCSF", "RBCCOUNTCSF", "WBCCSF", "POLYSCSF", "LYMPHSCSF", "EOSCSF", "PROTEINCSF", "GLUCCSF", "JCVIRUS", "CSFOLI", "IGGCSF", "LABACHR", "ACETBL"   Inflammation (CRP: Acute  ESR: Chronic) Lab Results  Component Value Date   CRP 2 05/15/2022   ESRSEDRATE 19 05/15/2022     Rheumatology No results found for: "RF", "ANA", "LABURIC", "URICUR", "LYMEIGGIGMAB", "LYMEABIGMQN", "HLAB27"   Coagulation Lab Results  Component Value Date   INR 1.89 (H) 05/01/2011   LABPROT 22.0 (H) 05/01/2011   APTT 43 (H) 05/01/2011   PLT 261 09/18/2019     Cardiovascular Lab Results  Component Value Date   TROPONINI 0.10 ([place]) 05/15/2016   HGB 18.0 (H) 09/18/2019   HCT 52.6 (H) 09/18/2019     Screening Lab Results  Component Value Date   SARSCOV2NAA NEGATIVE 09/19/2019   STAPHAUREUS NEGATIVE 05/01/2011   MRSAPCR NEGATIVE 05/01/2011     Cancer No results found for: "CEA", "CA125", "LABCA2"   Allergens No results found for: "ALMOND", "APPLE", "ASPARAGUS", "AVOCADO", "BANANA", "BARLEY", "BASIL", "BAYLEAF", "GREENBEAN", "LIMABEAN", "WHITEBEAN",  "BEEFIGE", "REDBEET", "BLUEBERRY", "BROCCOLI", "CABBAGE", "MELON", "CARROT", "CASEIN", "CASHEWNUT", "CAULIFLOWER", "CELERY"     Note: Lab results reviewed.  [place]  Drug: Mr. Omdahl  reports no history of drug use. Alcohol:  reports no history of alcohol use. Tobacco:  reports that he has been smoking cigarettes. He has a 25.00 pack-year smoking history. He has never used smokeless tobacco. Medical:  has a past medical history of Acute non-ST segment elevation myocardial infarction [facility]), Arthritis, CAD, multiple vessel, COPD (chronic obstructive pulmonary disease) ([place]), GERD (gastroesophageal reflux disease), History of heart artery stent, Hypercholesteremia, Hypertension, Levoscoliosis, Obstructive sleep apnea, and Tobacco abuse. Family: family history includes Heart attack in his mother.  Past Surgical History:  Procedure Laterality Date   BACK SURGERY     Degenerative Scoliosis T10-S1    CARDIAC CATHETERIZATION  2013   right leg   COLONOSCOPY WITH PROPOFOL N/A 02/05/2017   Procedure: COLONOSCOPY WITH PROPOFOL;  Surgeon: Lollie Sails, MD;  Location: [facility] ENDOSCOPY;  Service: Endoscopy;  Laterality: N/A;   CORONARY ARTERY BYPASS GRAFT  05/01/2011   x 4   ESOPHAGOGASTRODUODENOSCOPY (EGD) WITH PROPOFOL N/A 02/05/2017   Procedure: ESOPHAGOGASTRODUODENOSCOPY (EGD) WITH PROPOFOL;  Surgeon: Lollie Sails, MD;  Location: [facility] ENDOSCOPY;  Service: Endoscopy;  Laterality: N/A;   JOINT REPLACEMENT     KNEE SURGERY     right knee   Active Ambulatory Problems    Diagnosis Date Noted   S/P CABG x 4 05/18/2011   Smoking hx 05/18/2011   Hyperlipidemia LDL goal <70 05/18/2011   Nausea 08/02/2011   DJD (degenerative joint disease) 12/16/2012   Previous back surgery 06/18/2013   Chest pain 05/15/2016   Acute pulmonary edema (HCC)    Elevated troponin    Centrilobular emphysema (HCC)    Bronchitis    Right-sided thoracic back pain    Short of breath on exertion    Cough    CAD  (coronary artery disease), native coronary artery 10/06/2019   Anxiety 03/10/2015   Bacterial pneumonia 01/02/2011   Cervical stenosis  of spine 12/17/2014   Chronic pain syndrome 12/17/2014   COPD (chronic obstructive pulmonary disease) ([place]) 10/13/2014   Depression 03/10/2015   Gastro-esophageal reflux disease with esophagitis 01/02/2011   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   Osteoarthritis 12/16/2012   Mixed hyperlipidemia 01/02/2011   History of spinal surgery 06/18/2013   History of coronary artery bypass surgery 05/18/2011   Personal history of tobacco use, presenting hazards to health 05/18/2011   Pharmacologic therapy 05/11/2022   Disorder of skeletal system 05/11/2022   Problems influencing health status 05/11/2022   Chronic low back pain (1ry area of Pain) (Bilateral) (L>R) w/ sciatica (Left) 05/15/2022   Chronic lower extremity pain (4th area of Pain) (Left) 05/15/2022   Chronic neck pain (2ry area of Pain) (Bilateral) (R>L) 05/15/2022   Chronic foot pain (3ry area of Pain) (Left) 05/15/2022   Chronic hand pain (5th area of Pain) (Right) 05/15/2022   Lumbar radiculopathy (Left) 05/15/2022   Abnormal MRI, lumbar spine (12/16/2016) 05/15/2022   Abnormal MRI, cervical spine (12/16/2016) 05/15/2022   DDD (degenerative disc disease), cervical 05/15/2022   DDD (degenerative disc disease), lumbar 05/15/2022   Resolved Ambulatory Problems    Diagnosis Date Noted   No Resolved Ambulatory Problems   Past Medical History:  Diagnosis Date   Acute non-ST segment elevation myocardial infarction (HCC)    Arthritis    CAD, multiple vessel    GERD (gastroesophageal reflux disease)    History of heart artery stent    Hypercholesteremia    Hypertension    Levoscoliosis    Obstructive sleep apnea    Tobacco abuse    Constitutional Exam  General appearance: Well nourished, well developed, and well hydrated. In no apparent acute distress Vitals:   05/15/22 1348  BP: 131/86   Pulse: 96  Resp: 18  Temp: (!) 97 F (36.1 C)  SpO2: 96%  Weight: 184 lb (83.5 kg)  Height: 5' 9"  (1.753 m)   BMI Assessment: Estimated body mass index is 27.17 kg/m as calculated from the following:   Height as of this encounter: 5' 9"  (1.753 m).   Weight as of this encounter: 184 lb (83.5 kg).  BMI interpretation table: BMI level Category Range association with higher incidence of chronic pain  <18 kg/m2 Underweight   18.5-24.9 kg/m2 Ideal body weight   25-29.9 kg/m2 Overweight Increased incidence by 20%  30-34.9 kg/m2 Obese (Class I) Increased incidence by 68%  35-39.9 kg/m2 Severe obesity (Class II) Increased incidence by 136%  >40 kg/m2 Extreme obesity (Class III) Increased incidence by 254%   Patient's current BMI Ideal Body weight  Body mass index is 27.17 kg/m. Ideal body weight: 70.7 kg (155 lb 13.8 oz) Adjusted ideal body weight: 75.8 kg (167 lb 1.9 oz)   BMI Readings from Last 4 Encounters:  05/15/22 27.17 kg/m  10/06/19 32.01 kg/m  09/18/19 32.49 kg/m  04/16/18 29.65 kg/m   Wt Readings from Last 4 Encounters:  05/15/22 184 lb (83.5 kg)  10/06/19 216 lb 12 oz (98.3 kg)  09/18/19 220 lb (99.8 kg)  04/16/18 195 lb (88.5 kg)    Psych/Mental status: Alert, oriented x 3 (person, place, & time)       Eyes: PERLA Respiratory: No evidence of acute respiratory distress  Assessment  Primary Diagnosis & Pertinent Problem List: The primary encounter diagnosis was Chronic pain syndrome. Diagnoses of Chronic low back pain (1ry area of Pain) (Bilateral) (L>R) w/ sciatica (Left), Failed back surgical syndrome, Chronic neck pain (2ry area of Pain) (  Bilateral) (R>L), Chronic foot pain (3ry area of Pain) (Left), Chronic lower extremity pain (4th area of Pain) (Left), Lumbar radiculopathy (Left), Chronic hand pain (5th area of Pain) (Right), Pharmacologic therapy, Disorder of skeletal system, Problems influencing health status, Abnormal MRI, lumbar spine (12/16/2016),  Abnormal MRI, cervical spine (12/16/2016), DDD (degenerative disc disease), cervical, and DDD (degenerative disc disease), lumbar were also pertinent to this visit.  Visit Diagnosis (New problems to examiner): 1. Chronic pain syndrome   2. Chronic low back pain (1ry area of Pain) (Bilateral) (L>R) w/ sciatica (Left)   3. Failed back surgical syndrome   4. Chronic neck pain (2ry area of Pain) (Bilateral) (R>L)   5. Chronic foot pain (3ry area of Pain) (Left)   6. Chronic lower extremity pain (4th area of Pain) (Left)   7. Lumbar radiculopathy (Left)   8. Chronic hand pain (5th area of Pain) (Right)   9. Pharmacologic therapy   10. Disorder of skeletal system   11. Problems influencing health status   12. Abnormal MRI, lumbar spine (12/16/2016)   13. Abnormal MRI, cervical spine (12/16/2016)   14. DDD (degenerative disc disease), cervical   15. DDD (degenerative disc disease), lumbar    Plan of Care (Initial workup plan)  Note: Mr. Ammon was reminded that as per protocol, today's visit has been an evaluation only. We have not taken over the patient's controlled substance management.  Problem-specific plan: No problem-specific Assessment & Plan notes found for this encounter.  Lab Orders         Compliance Drug Analysis, Ur         Comp. Metabolic Panel (12)         Magnesium         Vitamin B12         Sedimentation rate         25-Hydroxy vitamin D Lcms D2+D3         C-reactive protein     Imaging Orders  No imaging studies ordered today   Referral Orders  No referral(s) requested today   Procedure Orders    No procedure(s) ordered today   Pharmacotherapy (current): Medications ordered:  No orders of the defined types were placed in this encounter.  Medications administered during this visit: Dilraj Killgore. Toya Smothers. had no medications administered during this visit.   Pharmacological management options:  Opioid Analgesics: The patient was informed that there is no guarantee  that he would be a candidate for opioid analgesics. The decision will be made following CDC guidelines. This decision will be based on the results of diagnostic studies, as well as Mr. Warwick's risk profile.   Membrane stabilizer: To be determined at a later time  Muscle relaxant: To be determined at a later time  NSAID: To be determined at a later time  Other analgesic(s): To be determined at a later time   Interventional management options: Mr. Tout was informed that there is no guarantee that he would be a candidate for interventional therapies. The decision will be based on the results of diagnostic studies, as well as Mr. Rivere's risk profile.  Procedure(s) under consideration:  Pending results of ordered studies      Interventional Therapies  Risk  Complexity Considerations:   Estimated body mass index is 27.17 kg/m as calculated from the following:   Height as of this encounter: 5' 9"  (1.753 m).   Weight as of this encounter: 184 lb (83.5 kg). WNL   Planned  Pending:  Pending the completion of the lumbar CT/MRI and cervical MRI scheduled for 05/18/2022.   Under consideration:   Pending further evaluation   Completed:   None at this time   Completed by other providers:   The patient indicates having had approximately 28 different injections, none of which he refers helped.   Therapeutic  Palliative (PRN) options:   None established      Provider-requested follow-up: Return for (18mn), Eval-day (M,W), (F2F), 2nd Visit, for review of ordered tests.  Future Appointments  Date Time Provider [place] 05/18/2022  1:00 PM MCM-CT [facility]  05/18/2022  2:00 PM MCM-MRI [facility]  05/18/2022  2:45 PM [facility]CM-MRI [facility]  07/03/2022  1:00 PM NMilinda Pointer MD [facility] None    Note by: FGaspar Cola MD Date: 05/15/2022; Time: 8:26 AM

## 2022-05-12 ENCOUNTER — Other Ambulatory Visit: Payer: Self-pay | Admitting: Orthopedic Surgery

## 2022-05-12 DIAGNOSIS — M5033 Other cervical disc degeneration, cervicothoracic region: Secondary | ICD-10-CM

## 2022-05-12 DIAGNOSIS — M4327 Fusion of spine, lumbosacral region: Secondary | ICD-10-CM

## 2022-05-12 DIAGNOSIS — M4155 Other secondary scoliosis, thoracolumbar region: Secondary | ICD-10-CM

## 2022-05-12 DIAGNOSIS — M545 Low back pain, unspecified: Secondary | ICD-10-CM

## 2022-05-15 ENCOUNTER — Ambulatory Visit: Payer: Medicare Other | Attending: Pain Medicine | Admitting: Pain Medicine

## 2022-05-15 ENCOUNTER — Encounter: Payer: Self-pay | Admitting: Pain Medicine

## 2022-05-15 VITALS — BP 131/86 | HR 96 | Temp 97.0°F | Resp 18 | Ht 69.0 in | Wt 184.0 lb

## 2022-05-15 DIAGNOSIS — G894 Chronic pain syndrome: Secondary | ICD-10-CM | POA: Insufficient documentation

## 2022-05-15 DIAGNOSIS — M961 Postlaminectomy syndrome, not elsewhere classified: Secondary | ICD-10-CM | POA: Insufficient documentation

## 2022-05-15 DIAGNOSIS — M5416 Radiculopathy, lumbar region: Secondary | ICD-10-CM | POA: Diagnosis present

## 2022-05-15 DIAGNOSIS — M79672 Pain in left foot: Secondary | ICD-10-CM | POA: Diagnosis present

## 2022-05-15 DIAGNOSIS — M899 Disorder of bone, unspecified: Secondary | ICD-10-CM | POA: Insufficient documentation

## 2022-05-15 DIAGNOSIS — R937 Abnormal findings on diagnostic imaging of other parts of musculoskeletal system: Secondary | ICD-10-CM | POA: Diagnosis present

## 2022-05-15 DIAGNOSIS — M79641 Pain in right hand: Secondary | ICD-10-CM | POA: Diagnosis present

## 2022-05-15 DIAGNOSIS — G8929 Other chronic pain: Secondary | ICD-10-CM | POA: Diagnosis present

## 2022-05-15 DIAGNOSIS — M5030 Other cervical disc degeneration, unspecified cervical region: Secondary | ICD-10-CM | POA: Insufficient documentation

## 2022-05-15 DIAGNOSIS — Z79899 Other long term (current) drug therapy: Secondary | ICD-10-CM | POA: Diagnosis present

## 2022-05-15 DIAGNOSIS — M5136 Other intervertebral disc degeneration, lumbar region: Secondary | ICD-10-CM | POA: Insufficient documentation

## 2022-05-15 DIAGNOSIS — M542 Cervicalgia: Secondary | ICD-10-CM | POA: Insufficient documentation

## 2022-05-15 DIAGNOSIS — M5442 Lumbago with sciatica, left side: Secondary | ICD-10-CM | POA: Insufficient documentation

## 2022-05-15 DIAGNOSIS — M79605 Pain in left leg: Secondary | ICD-10-CM | POA: Diagnosis present

## 2022-05-15 DIAGNOSIS — Z789 Other specified health status: Secondary | ICD-10-CM | POA: Insufficient documentation

## 2022-05-15 NOTE — Progress Notes (Unsigned)
Safety precautions to be maintained throughout the outpatient stay will include: orient to surroundings, keep bed in low position, maintain call bell within reach at all times, provide assistance with transfer out of bed and ambulation.  

## 2022-05-15 NOTE — Patient Instructions (Signed)

## 2022-05-18 ENCOUNTER — Ambulatory Visit
Admission: RE | Admit: 2022-05-18 | Discharge: 2022-05-18 | Disposition: A | Discharge status: Home / Self Care | Payer: Medicare Other | Source: Ambulatory Visit | Attending: Orthopedic Surgery | Admitting: Orthopedic Surgery

## 2022-05-18 DIAGNOSIS — M4155 Other secondary scoliosis, thoracolumbar region: Secondary | ICD-10-CM | POA: Diagnosis present

## 2022-05-18 DIAGNOSIS — M545 Low back pain, unspecified: Secondary | ICD-10-CM

## 2022-05-18 DIAGNOSIS — M5033 Other cervical disc degeneration, cervicothoracic region: Secondary | ICD-10-CM

## 2022-05-18 DIAGNOSIS — M4327 Fusion of spine, lumbosacral region: Secondary | ICD-10-CM

## 2022-05-19 LAB — COMPLIANCE DRUG ANALYSIS, UR

## 2022-05-22 LAB — MAGNESIUM: Magnesium: 1.9 mg/dL (ref 1.6–2.3)

## 2022-05-22 LAB — COMP. METABOLIC PANEL (12)
AST: 18 IU/L (ref 0–40)
Albumin/Globulin Ratio: 1.8 (ref 1.2–2.2)
Albumin: 4.6 g/dL (ref 3.8–4.9)
Alkaline Phosphatase: 76 IU/L (ref 44–121)
BUN/Creatinine Ratio: 18 (ref 9–20)
BUN: 23 mg/dL (ref 6–24)
Bilirubin Total: <0.2 mg/dL (ref 0.0–1.2)
Calcium: 10 mg/dL (ref 8.7–10.2)
Chloride: 102 mmol/L (ref 96–106)
Creatinine, Ser: 1.25 mg/dL (ref 0.76–1.27)
Globulin, Total: 2.5 g/dL (ref 1.5–4.5)
Glucose: 86 mg/dL (ref 70–99)
Potassium: 4.4 mmol/L (ref 3.5–5.2)
Sodium: 143 mmol/L (ref 134–144)
Total Protein: 7.1 g/dL (ref 6.0–8.5)
eGFR: 67 mL/min/{1.73_m2} (ref >59)

## 2022-05-22 LAB — 25-HYDROXY VITAMIN D LCMS D2+D3
25-Hydroxy, Vitamin D-2: <1 ng/mL
25-Hydroxy, Vitamin D-3: 33 ng/mL
25-Hydroxy, Vitamin D: 33 ng/mL

## 2022-05-22 LAB — C-REACTIVE PROTEIN: CRP: 2 mg/L (ref 0–10)

## 2022-05-22 LAB — VITAMIN B12: Vitamin B-12: 791 pg/mL (ref 232–1245)

## 2022-05-22 LAB — SEDIMENTATION RATE: Sed Rate: 19 mm/hr (ref 0–30)

## 2022-06-21 NOTE — Patient Instructions (Signed)
  ____________________________________________________________________________________________  Patient Information update  To: All of our patients.  Re: Name change.  It has been made official that our current name, "[facility]"   will soon be changed to "[facility]".   The purpose of this change is to eliminate any confusion created by the concept of our practice being a "Medication Management Pain Clinic". In the past this has led to the misconception that we treat pain primarily by the use of prescription medications.  Nothing can be farther from the truth.   Understanding PAIN MANAGEMENT: To further understand what our practice does, you first have to understand that "Pain Management" is a subspecialty that requires additional training once a physician has completed their specialty training, which can be in either Anesthesia, Neurology, Psychiatry, or Physical Medicine and Rehabilitation (PMR). Each one of these contributes to the final approach taken by each physician to the management of their patient's pain. To be a "Pain Management Specialist" you must have first completed one of the specialty trainings below.  Anesthesiologists - trained in clinical pharmacology and interventional techniques such as nerve blockade and regional as well as central neuroanatomy. They are trained to block pain before, during, and after surgical interventions.  Neurologists - trained in the diagnosis and pharmacological treatment of complex neurological conditions, such as Multiple Sclerosis, Parkinson's, spinal cord injuries, and other systemic conditions that may be associated with symptoms that may include but are not limited to pain. They tend to rely primarily on the treatment of chronic pain using prescription medications.  Psychiatrist - trained in conditions affecting the psychosocial  wellbeing of patients including but not limited to depression, anxiety, schizophrenia, personality disorders, addiction, and other substance use disorders that may be associated with chronic pain. They tend to rely primarily on the treatment of chronic pain using prescription medications.   Physical Medicine and Rehabilitation (PMR) physicians, also known as physiatrists - trained to treat a wide variety of medical conditions affecting the brain, spinal cord, nerves, bones, joints, ligaments, muscles, and tendons. Their training is primarily aimed at treating patients that have suffered injuries that have caused severe physical impairment. Their training is primarily aimed at the physical therapy and rehabilitation of those patients. They may also work alongside orthopedic surgeons or neurosurgeons using their expertise in assisting surgical patients to recover after their surgeries.  INTERVENTIONAL PAIN MANAGEMENT is sub-subspecialty of Pain Management.  Our physicians are Board-certified in Anesthesia, Pain Management, and Interventional Pain Management.  This meaning that not only have they been trained and Board-certified in their specialty of Anesthesia, and subspecialty of Pain Management, but they have also received further training in the sub-subspecialty of Interventional Pain Management, in order to become Board-certified as INTERVENTIONAL PAIN MANAGEMENT SPECIALIST.    Mission: Our goal is to use our skills in  INTERVENTIONAL PAIN MANAGEMENT as alternatives to the chronic use of prescription opioid medications for the treatment of pain. To make this more clear, we have changed our name to reflect what we do and offer. We will continue to offer medication management assessment and recommendations, but we will not be taking over any patient's medication management.  ____________________________________________________________________________________________     

## 2022-06-21 NOTE — Progress Notes (Unsigned)
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   Electronically Signed By: Inez Catalina M.D. On: 11/27/2016 19:49  Complexity Note: Imaging results reviewed.                         Meds   Current Outpatient Medications:    albuterol (VENTOLIN HFA) 108 (90 Base) MCG/ACT inhaler, Inhale 2 puffs into the lungs every 6 (six) hours as needed for wheezing or shortness of breath., Disp: 8 g, Rfl: 1   cetirizine (ZYRTEC) 10 MG tablet, Take 10 mg by mouth daily., Disp: , Rfl:    folic acid (FOLVITE) 1 MG tablet, Take 1 mg by mouth daily., Disp: , Rfl:    lisinopril (ZESTRIL) 2.5 MG tablet, Take 1 tablet (2.5 mg total) by mouth every evening., Disp: 90 tablet, Rfl: 3   pantoprazole (PROTONIX) 20 MG tablet,  Take 1 tablet (20 mg total) by mouth daily for 14 days. (Patient taking differently:  Take 40 mg by mouth 2 (two) times daily.), Disp: 14 tablet, Rfl: 0   Potassium 99 MG TABS, Take 1 tablet by mouth daily., Disp: , Rfl:    rosuvastatin (CRESTOR) 20 MG tablet, Take 1 tablet (20 mg total) by mouth every evening., Disp: 90 tablet, Rfl: 3   sucralfate (CARAFATE) 1 g tablet, Take 1 g by mouth 2 (two) times daily. , Disp: , Rfl:    tamsulosin (FLOMAX) 0.4 MG CAPS capsule, Take 0.4 mg by mouth daily., Disp: , Rfl:   ROS  Constitutional: Denies any fever or chills Gastrointestinal: No reported hemesis, hematochezia, vomiting, or acute GI distress Musculoskeletal: Denies any acute onset joint swelling, redness, loss of ROM, or weakness Neurological: No reported episodes of acute onset apraxia, aphasia, dysarthria, agnosia, amnesia, paralysis, loss of coordination, or loss of consciousness  Allergies  [name] is allergic to other, tramadol, codeine, and vicodin [hydrocodone-acetaminophen].  [place]  Drug: [name]  reports no history of drug use. Alcohol:  reports no history of alcohol use. Tobacco:  reports that he has been smoking cigarettes. He has a 25.00 pack-year smoking history. He has never used smokeless tobacco. Medical:  has a past medical history of Acute non-ST segment elevation myocardial infarction [facility]), Arthritis, CAD, multiple vessel, COPD (chronic obstructive pulmonary disease) ([place]), GERD (gastroesophageal reflux disease), History of heart artery stent, Hypercholesteremia, Hypertension, Levoscoliosis, Obstructive sleep apnea, and Tobacco abuse. Surgical: Mr. [name]  has a past surgical history that includes Coronary artery bypass graft (05/01/2011); Knee surgery; Cardiac catheterization (2013); Back surgery; Joint replacement; Colonoscopy with propofol (N/A, 02/05/2017); and Esophagogastroduodenoscopy (egd) with propofol (N/A, 02/05/2017). Family: family history includes Heart attack in  his mother.  Constitutional Exam  General appearance: Well nourished, well developed, and well hydrated. In no apparent acute distress There were no vitals filed for this visit. BMI Assessment: Estimated body mass index is 27.17 kg/m as calculated from the following:   Height as of 05/15/22: _0  (1.753 m).   Weight as of 05/15/22: 184 lb (83.5 kg).  BMI interpretation table: BMI level Category Range association with higher incidence of chronic pain  <18 kg/m2 Underweight   18.5-24.9 kg/m2 Ideal body weight   25-29.9 kg/m2 Overweight Increased incidence by 20%  30-34.9 kg/m2 Obese (Class I) Increased incidence by 68%  35-39.9 kg/m2 Severe obesity (Class II) Increased incidence by 136%  >40 kg/m2 Extreme obesity (Class III) Increased incidence by 254%   Patient's current BMI Ideal Body weight  There is no height or weight on file to calculate BMI. Patient weight not recorded   BMI Readings from Last 4 Encounters:  05/15/22 27.17 kg/m  10/06/19 32.01 kg/m  09/18/19 32.49 kg/m  04/16/18 29.65 kg/m   Wt Readings from Last 4 Encounters:  05/15/22 184 lb (83.5 kg)  10/06/19 216 lb 12 oz (98.3 kg)  09/18/19 220 lb (99.8 kg)  04/16/18 195 lb (88.5 kg)    Psych/Mental status: Alert, oriented x 3 (person, place, & time)       Eyes: PERLA Respiratory: No evidence of acute respiratory distress  Assessment & Plan  Primary Diagnosis & Pertinent Problem List: The primary encounter diagnosis was Chronic pain syndrome. Diagnoses of Chronic low back pain (1ry area of Pain) (Bilateral) (L>R) w/ sciatica (Left), Chronic neck pain (2ry area of Pain) (Bilateral) (R>L), Chronic foot pain (3ry area of Pain) (Left), Chronic lower extremity pain (4th area of Pain) (Left), Chronic hand pain (5th area of Pain) (Right), Abnormal MRI, lumbar spine (12/16/2016),  and Abnormal MRI, cervical spine (12/16/2016) were also pertinent to this visit.  Visit Diagnosis: 1. Chronic pain syndrome   2. Chronic low  back pain (1ry area of Pain) (Bilateral) (L>R) w/ sciatica (Left)   3. Chronic neck pain (2ry area of Pain) (Bilateral) (R>L)   4. Chronic foot pain (3ry area of Pain) (Left)   5. Chronic lower extremity pain (4th area of Pain) (Left)   6. Chronic hand pain (5th area of Pain) (Right)   7. Abnormal MRI, lumbar spine (12/16/2016)   8. Abnormal MRI, cervical spine (12/16/2016)    Problems updated and reviewed during this visit: No problems updated.  Plan of Care  Pharmacotherapy (Medications Ordered): No orders of the defined types were placed in this encounter.  Procedure Orders    No procedure(s) ordered today   Lab Orders  No laboratory test(s) ordered today   Imaging Orders  No imaging studies ordered today   Referral Orders  No referral(s) requested today    Pharmacological management:  Opioid Analgesics: I will not be prescribing any opioids at this time Membrane stabilizer: I will not be prescribing any at this time Muscle relaxant: I will not be prescribing any at this time NSAID: I will not be prescribing any at this time Other analgesic(s): I will not be prescribing any at this time      Interventional Therapies  Risk  Complexity Considerations:   Estimated body mass index is 27.17 kg/m as calculated from the following:   Height as of this encounter: _0  (1.753 m).   Weight as of this encounter: 184 lb (83.5 kg). WNL   Planned  Pending:   Pending the completion of the lumbar CT/MRI and cervical MRI scheduled for 05/18/2022.   Under consideration:   Pending further evaluation   Completed:   None at this time   Completed by other providers:   The patient indicates having had approximately 28 different injections, none of which he refers helped.   Therapeutic  Palliative (PRN) options:   None established    Provider-requested follow-up: No follow-ups on file. Recent Visits Date Type Provider Dept  05/15/22 Office Visit Milinda Pointer, MD  [facility]  Showing recent visits within past 90 days and meeting all other requirements Future Appointments Date Type Provider Dept  07/03/22 Appointment Milinda Pointer, MD [facility]  Showing future appointments within next 90 days and meeting all other requirements  Primary Care Physician: Remi Haggard, FNP Note by: Gaspar Cola, MD Date: 07/03/2022; Time: 5:48 PM

## 2022-07-03 ENCOUNTER — Ambulatory Visit (HOSPITAL_BASED_OUTPATIENT_CLINIC_OR_DEPARTMENT_OTHER): Payer: Medicare Other | Admitting: Pain Medicine

## 2022-07-03 DIAGNOSIS — R937 Abnormal findings on diagnostic imaging of other parts of musculoskeletal system: Secondary | ICD-10-CM

## 2022-07-03 DIAGNOSIS — G8929 Other chronic pain: Secondary | ICD-10-CM

## 2022-07-03 DIAGNOSIS — Z91199 Patient's noncompliance with other medical treatment and regimen due to unspecified reason: Secondary | ICD-10-CM

## 2022-07-03 DIAGNOSIS — M4807 Spinal stenosis, lumbosacral region: Secondary | ICD-10-CM | POA: Insufficient documentation

## 2022-07-03 DIAGNOSIS — M48061 Spinal stenosis, lumbar region without neurogenic claudication: Secondary | ICD-10-CM | POA: Insufficient documentation

## 2022-07-03 DIAGNOSIS — M47816 Spondylosis without myelopathy or radiculopathy, lumbar region: Secondary | ICD-10-CM | POA: Insufficient documentation

## 2022-07-03 DIAGNOSIS — G894 Chronic pain syndrome: Secondary | ICD-10-CM

## 2022-07-19 ENCOUNTER — Telehealth: Payer: Self-pay | Admitting: Cardiovascular Disease

## 2022-07-19 NOTE — Telephone Encounter (Signed)
    Primary Cardiologist:None  Chart reviewed as part of pre-operative protocol coverage. Because of Dalton CLELAND Jr.'s past medical history and time since last visit, he/she will require a follow-up visit in order to better assess preoperative cardiovascular risk.  Pre-op covering staff: - Please schedule appointment and call patient to inform them. - Please contact requesting surgeon's office via preferred method (i.e, phone, fax) to inform them of need for appointment prior to surgery.  If applicable, this message will also be routed to pharmacy pool and/or primary cardiologist for input on holding anticoagulant/antiplatelet agent as requested below so that this information is available at time of patient's appointment.   Dalton Asters, NP  07/19/2022, 4:30 PM

## 2022-07-19 NOTE — Telephone Encounter (Signed)
   Pre-operative Risk Assessment    Patient Name: [name].  DOB: Mar 28, 1966 MRN: [id number]      Request for Surgical Clearance    Procedure:   Posterior L2 and L3 laminectomy, removal of spinal implants from L4-S1, L2-iliac wing revision extension instrumentation and fusion, L5-S1 TLIF spacer, bone autograft, lifenet allograft, bone morphogenic protein (3hrs S, 4.5 hrs T)  Date of Surgery:  Clearance 09/06/22                                 Surgeon:  Dr Dorita Fray Surgeon's Group or Practice Name:  [facility] - surgery at [facility] Phone number:  [phone] Fax number:  [phone]   Type of Clearance Requested:   - Medical    Type of Anesthesia:  General    Additional requests/questions:    SignedNorman Herrlich   07/19/2022, 3:53 PM

## 2022-07-20 NOTE — Telephone Encounter (Signed)
Pt is scheduled to see Eula Listen, PA on 12/11.  Pre-op will be addressed at this time.

## 2022-07-24 NOTE — Progress Notes (Unsigned)
Cardiology Office Note    Date:  07/24/2022   ID:  Dalton Shore., DOB 05-14-1966, MRN [id number]  PCP:  Armando Gang, FNP  Cardiologist:  Julien Nordmann, MD  Electrophysiologist:  None   Chief Complaint: Preoperative cardiac risk stratification  History of Present Illness:   Dalton Mondor. is a 56 y.o. male with history of CAD status post four-vessel CABG (LIMA to LAD, SVG to diagonal, and SVG sequentially to OM1/OM2) in 2012 in the setting of an MI status post PCI to the mid LCx/OM and right PL branch in 2015 at [facility], HTN, HLD, tobacco use, and chronic back pain including scoliosis and lumbar fusion who presents for preoperative cardiac risk stratification.  He was admitted to [facility] in 04/2011 with an MI.  LHC showed multivessel CAD.  He was transferred to [facility] and underwent 4-vessel bypass.  Following discharge, he was followed by Dr. Mariah Milling 05/2013.  After this, he had lost his job and health insurance and was subsequently referred to [facility] cardiology in 2015.  In the setting of exertional and rest angina he underwent LHC at [facility] in 06/2014 showed severe native vessel CAD including 99% subtotal occlusion of the mid LCx, CTO of a superior branch of OM, subtotal occlusion of the inferior branch of OM, subtotal occlusion of RPL.  Patent LIMA to LAD and SVG to diagonal with an occluded SVG to sequential OM at the ostial anastomosis.  He underwent successful PCI/DES to the native mid LCx/OM as well as PCI/DES to the RPL branch.  Normal LVEF.  He has not required ischemic evaluation since.  Echo in 2017 showed an EF of 60 to 65%, no regional wall motion abnormalities, normal LV diastolic function parameters, mildly dilated left atrium, normal RV systolic function and PASP.  He was last seen in the office in 09/2019 to reestablish care after having transitioned his care to [facility].  At that time, he reported he had been out of cardiac medications for 8 months.  He suffered a fall in 01/2022  and has been experiencing severe back pain since that time.  He has been evaluated by orthopedics and is scheduled to undergo postop L4 and L5 laminectomy with removal of spinal implant, L4/S1, L2-iliac wing revision extension and fusion along with L5-S1 TLIF spacer, and bone autograft on 09/06/2022.  ***  [facility] Activity Status Index: *** METs Revised Cardiac Risk Index: *** risk for noncardiac surgery with an estimated rate of ***% for adverse cardiac event in the perioperative timeframe    Labs independently reviewed: 04/2022 - magnesium 1.9, BUN 23, serum creatinine 1.25, potassium 4.4, albumin 4.6, AST normal 09/2019 - Hgb 16.8, PLT 213, TSH normal 02/2018 - TC 146, TG 136, HDL 32, LDL 87, A1c 5.6  Past Medical History:  Diagnosis Date   Acute non-ST segment elevation myocardial infarction (HCC)    Arthritis    CAD, multiple vessel    COPD (chronic obstructive pulmonary disease) (HCC)    GERD (gastroesophageal reflux disease)    History of heart artery stent    Hypercholesteremia    Hypertension    Levoscoliosis    multi level degenrative disc disease   Obstructive sleep apnea    Tobacco abuse     Past Surgical History:  Procedure Laterality Date   BACK SURGERY     Degenerative Scoliosis T10-S1    CARDIAC CATHETERIZATION  2013   right leg   COLONOSCOPY WITH PROPOFOL N/A 02/05/2017   Procedure: COLONOSCOPY  WITH PROPOFOL;  Surgeon: Christena Deem, MD;  Location: [facility] ENDOSCOPY;  Service: Endoscopy;  Laterality: N/A;   CORONARY ARTERY BYPASS GRAFT  05/01/2011   x 4   ESOPHAGOGASTRODUODENOSCOPY (EGD) WITH PROPOFOL N/A 02/05/2017   Procedure: ESOPHAGOGASTRODUODENOSCOPY (EGD) WITH PROPOFOL;  Surgeon: Christena Deem, MD;  Location: [facility] ENDOSCOPY;  Service: Endoscopy;  Laterality: N/A;   JOINT REPLACEMENT     KNEE SURGERY     right knee    Current Medications: No outpatient medications have been marked as taking for the 07/31/22 encounter (Appointment) with Dalton Barges,  PA-C.    Allergies:   Other, Tramadol, Codeine, and Vicodin [hydrocodone-acetaminophen]   Social History   Socioeconomic History   Marital status: Married    Spouse name: Not on file   Number of children: Not on file   Years of education: Not on file   Highest education level: Not on file  Occupational History   Not on file  Tobacco Use   Smoking status: Every Day    Packs/day: 1.00    Years: 25.00    Total pack years: 25.00    Types: Cigarettes    Last attempt to quit: 04/28/2011    Years since quitting: 11.2   Smokeless tobacco: Never  Vaping Use   Vaping Use: Never used  Substance and Sexual Activity   Alcohol use: No   Drug use: No   Sexual activity: Not on file  Other Topics Concern   Not on file  Social History Narrative   Not on file   Social Determinants of Health   Financial Resource Strain: Not on file  Food Insecurity: Not on file  Transportation Needs: Not on file  Physical Activity: Not on file  Stress: Not on file  Social Connections: Not on file     Family History:  The patient's family history includes Heart attack in his mother.  ROS:   ROS   EKGs/Labs/Other Studies Reviewed:    Studies reviewed were summarized above. The additional studies were reviewed today:  2D echo 05/16/2016: - Left ventricle: The cavity size was normal. Systolic function was    normal. The estimated ejection fraction was in the range of 60%    to 65%. Wall motion was normal; there were no regional wall    motion abnormalities. Left ventricular diastolic function    parameters were normal.  - Left atrium: The atrium was mildly dilated.  - Right ventricle: Systolic function was normal.  - Pulmonary arteries: Systolic pressure was within the normal    range.  __________  [facility] 07/12/2014 [facility]): CORONARY ANGIOGRAPHY  The left main (LMCA)  is a large-caliber  vessel that  originates from  the left coronary  sinus. It bifurcates  into the left  anterior  descending  (LAD)  and left circumflex  (LCx) arteries.  There is no  angiographic evidence  of significant  disease in the  LMCA.  The LAD is a large-caliber  vessel  that gives off two  medium-sized  diagonal (D) branches  before it wraps  around the apex.  There is  complete total occlusion  in the mid  LAD. The LAD is  filled by a  patent LIMA graft.  The LCx is a medium-caliber  vessel  that gives off three  medium-sized  obtuse marginal (OM)  branches and  then continues as  a small vessel  in the AV groove.  There is 99% subtotal  occlusion mid  LCx at  the  bifurcation site  of OM which led to  a complete total  occlusion of  the superior branch  of OM (retrograde  flow) and subtotal  occlusion  of the inferior branch  of the OM (forward  flow).   The right coronary  artery (RCA) is  a large-caliber  vessel  originating from  the right coronary  sinus. It bifurcates  distally  into the posterior  descending artery  (PDA) and a posterolateral  branch (PL) consistent  with a right  dominant system.  There is 30%  mid RCA stenosis,  subtotal occlusion  of a medium caliber  PL branch  with distal branches  filled by bridging  collaterals  and left to  right collaterals.  The right PDA has  no significant  disease.    Grafts  LIMA-to-LAD: widely  patent.  SVG-to-Diag: patent.  SVG-to-OM: occluded  at the ostial  anastomosis (based  on the  patient's report,  he had 4 vessel  bypass, it could  be a jump graft  for the OM branches).     LEFT VENTRICULOGRAPHY  Normal left ventricular  contractile  function (EF >55%)    COMPLICATIONS: No  complications   SUMMARY FINDINGS:  Successful PCI      1. Coronary  artery disease  as detailed above  including: 99%      subtotal  occlusion mid  LCx, CTO of superior  branch of OM,      subtotal  occlusion of the  inferior branch  of the OM, 30%  mid RCA      stenosis,  , subtotal occlusion  of a medium  caliber PL branch  and      occluded  SVG-OM  graft      2. Grafts:  LIMA-to-LAD:  widely patent; SVG-to-Diag:  patent;  SVG-      to-OM:  occluded at the  ostial anastomosis  (based on the      patient's  report, he had  4 vessel bypass,  it could be a jump      graft  for the OM branches).      3. Successful  PCI to the  midLCx/OM subtotal  occlusion with      placement  of a Promus Premier  2.5 x 16 mm  DES resulted 0%      residual  stenosis and TIMI  3 flow.      4. Successful  PCI to the  right PL branch  with placement of  a      Promus  Premier 2.25 x 32  mm DES resulted  0% residual stenosis      and TIMI  3 flow.      5. Normal  left heart filling  pressure: LVEDP  = 13 mm Hg  PLAN/DISPOSITION AND  FOLLOWUP:      Post PCI  routine with femoral  sheath protocol.      Dual antiplatelet  therapy  with ASA and clopidogrel.      Clopidogrel  response gene  CYP2C19 genotyping.     EKG:  EKG is ordered today.  The EKG ordered today demonstrates ***  Recent Labs: 05/15/2022: BUN 23; Creatinine, Ser 1.25; Magnesium 1.9; Potassium 4.4; Sodium 143  Recent Lipid Panel    Component Value Date/Time   CHOL 164 08/02/2011 0822   TRIG 140 08/02/2011 0822   HDL 37 (L) 08/02/2011 0822   CHOLHDL 4.4 08/02/2011 9242  CHOLHDL 5.1 04/30/2011 0705   VLDL 23 04/30/2011 0705   LDLCALC 99 08/02/2011 0822    PHYSICAL EXAM:    VS:  There were no vitals taken for this visit.  BMI: There is no height or weight on file to calculate BMI.  Physical Exam  Wt Readings from Last 3 Encounters:  05/15/22 184 lb (83.5 kg)  10/06/19 216 lb 12 oz (98.3 kg)  09/18/19 220 lb (99.8 kg)     ASSESSMENT & PLAN:   Preoperative cardiac risk stratification: ***  CAD status post CABG status post subsequent PCI with ***:  HTN: Blood pressure  HLD: LDL 87 in 02/2018.   {Are you ordering a CV Procedure (e.g. stress test, cath, DCCV, TEE, etc)?   Press F2        :[id number]}     Disposition: F/u with Dr. Mariah Milling or an APP in ***.   Medication  Adjustments/Labs and Tests Ordered: Current medicines are reviewed at length with the patient today.  Concerns regarding medicines are outlined above. Medication changes, Labs and Tests ordered today are summarized above and listed in the Patient Instructions accessible in Encounters.   Signed, Eula Listen, PA-C 07/24/2022 1:03 PM     [facility] [place] [address] [place], [place] [address] [phone]

## 2022-07-31 ENCOUNTER — Encounter: Payer: Self-pay | Admitting: Physician Assistant

## 2022-07-31 ENCOUNTER — Ambulatory Visit: Payer: Medicare Other | Attending: Physician Assistant | Admitting: Physician Assistant

## 2022-07-31 VITALS — BP 120/74 | HR 74 | Ht 69.0 in | Wt 190.0 lb

## 2022-07-31 DIAGNOSIS — Z951 Presence of aortocoronary bypass graft: Secondary | ICD-10-CM | POA: Insufficient documentation

## 2022-07-31 DIAGNOSIS — Z01810 Encounter for preprocedural cardiovascular examination: Secondary | ICD-10-CM | POA: Insufficient documentation

## 2022-07-31 DIAGNOSIS — E785 Hyperlipidemia, unspecified: Secondary | ICD-10-CM | POA: Diagnosis present

## 2022-07-31 DIAGNOSIS — Z720 Tobacco use: Secondary | ICD-10-CM | POA: Insufficient documentation

## 2022-07-31 DIAGNOSIS — I2510 Atherosclerotic heart disease of native coronary artery without angina pectoris: Secondary | ICD-10-CM | POA: Insufficient documentation

## 2022-07-31 DIAGNOSIS — I10 Essential (primary) hypertension: Secondary | ICD-10-CM | POA: Diagnosis present

## 2022-07-31 MED ORDER — ASPIRIN 81 MG PO TBEC: 81.0000 mg | DELAYED_RELEASE_TABLET | Freq: Every day | ORAL | 3 refills | Status: AC

## 2022-07-31 NOTE — Patient Instructions (Signed)
Medication Instructions:  Your physician has recommended you make the following change in your medication:   START Aspirin 81 mg once daily   *If you need a refill on your cardiac medications before your next appointment, please call your pharmacy*   Lab Work: None  If you have labs (blood work) drawn today and your tests are completely normal, you will receive your results only by: MyChart Message (if you have MyChart) OR A paper copy in the mail If you have any lab test that is abnormal or we need to change your treatment, we will call you to review the results.   Testing/Procedures: None    Follow-Up: At [facility], you and your health needs are our priority.  As part of our continuing mission to provide you with exceptional heart care, we have created designated Provider Care Teams.  These Care Teams include your primary Cardiologist (physician) and Advanced Practice Providers (APPs -  Physician Assistants and Nurse Practitioners) who all work together to provide you with the care you need, when you need it.  We recommend signing up for the patient portal called "MyChart".  Sign up information is provided on this After Visit Summary.  MyChart is used to connect with patients for Virtual Visits (Telemedicine).  Patients are able to view lab/test results, encounter notes, upcoming appointments, etc.  Non-urgent messages can be sent to your provider as well.   To learn more about what you can do with MyChart, go to [email or web address].    Your next appointment:   6 month(s)  The format for your next appointment:   In Person  Provider:   Julien Nordmann, MD or Eula Listen, PA-C       Important Information About Sugar

## 2022-08-08 ENCOUNTER — Other Ambulatory Visit: Payer: Self-pay | Admitting: Orthopedic Surgery

## 2022-08-08 DIAGNOSIS — Z13820 Encounter for screening for osteoporosis: Secondary | ICD-10-CM

## 2022-08-08 DIAGNOSIS — M48061 Spinal stenosis, lumbar region without neurogenic claudication: Secondary | ICD-10-CM

## 2022-08-08 DIAGNOSIS — M4155 Other secondary scoliosis, thoracolumbar region: Secondary | ICD-10-CM

## 2022-08-08 DIAGNOSIS — M899 Disorder of bone, unspecified: Secondary | ICD-10-CM

## 2022-08-08 DIAGNOSIS — M5459 Other low back pain: Secondary | ICD-10-CM

## 2022-08-08 DIAGNOSIS — M960 Pseudarthrosis after fusion or arthrodesis: Secondary | ICD-10-CM

## 2022-08-08 DIAGNOSIS — M4327 Fusion of spine, lumbosacral region: Secondary | ICD-10-CM

## 2022-08-16 ENCOUNTER — Ambulatory Visit
Admission: RE | Admit: 2022-08-16 | Discharge: 2022-08-16 | Disposition: A | Discharge status: Home / Self Care | Payer: Medicare Other | Source: Ambulatory Visit | Attending: Orthopedic Surgery | Admitting: Orthopedic Surgery

## 2022-08-16 DIAGNOSIS — M4155 Other secondary scoliosis, thoracolumbar region: Secondary | ICD-10-CM | POA: Diagnosis not present

## 2022-08-16 DIAGNOSIS — M069 Rheumatoid arthritis, unspecified: Secondary | ICD-10-CM | POA: Diagnosis not present

## 2022-08-16 DIAGNOSIS — M960 Pseudarthrosis after fusion or arthrodesis: Secondary | ICD-10-CM | POA: Diagnosis not present

## 2022-08-16 DIAGNOSIS — M5459 Other low back pain: Secondary | ICD-10-CM | POA: Diagnosis not present

## 2022-08-16 DIAGNOSIS — M4327 Fusion of spine, lumbosacral region: Secondary | ICD-10-CM | POA: Diagnosis present

## 2022-08-16 DIAGNOSIS — Z13820 Encounter for screening for osteoporosis: Secondary | ICD-10-CM | POA: Diagnosis present

## 2022-08-16 DIAGNOSIS — M48061 Spinal stenosis, lumbar region without neurogenic claudication: Secondary | ICD-10-CM | POA: Diagnosis not present

## 2022-08-16 DIAGNOSIS — M899 Disorder of bone, unspecified: Secondary | ICD-10-CM | POA: Insufficient documentation

## 2022-08-16 DIAGNOSIS — M85852 Other specified disorders of bone density and structure, left thigh: Secondary | ICD-10-CM | POA: Diagnosis not present

## 2022-08-23 ENCOUNTER — Telehealth: Payer: Self-pay | Admitting: Cardiovascular Disease

## 2022-08-23 NOTE — Telephone Encounter (Signed)
Nadine of [facility] is following up, requesting updates on clearance.  Fax#: [phone]

## 2022-08-23 NOTE — Telephone Encounter (Signed)
Refaxed Christell Faith, PA-C's preop clearance note to [phone] .

## 2023-01-29 NOTE — Progress Notes (Deleted)
Cardiology Office Note  Date:  01/29/2023   ID:  [name], [name] [birth year], MRN [id number]  PCP:  Armando Gang, FNP   No chief complaint on file.   HPI:  57 year old male with a long smoking history with history of  significant DJD, scoliosis, sciatica.  Chronic back pain, prior surgery CAD, bypass surgery x4 in 2012,  Who presents for follow-up of his coronary artery disease  Last seen by myself in clinic February 2021 Seen by one of our providers December 2023 for preop evaluation  Previously seen in our clinic, last seen 2014  lost his job and [employer], applied for charity care, and was referred to [facility] and Vascular.  2015 was having chest pain coronary angiography revealed a patent LIMA to LAD, a patent SVG to diagonal, an occluded SVG to OM, a mid LCx 99% subtotal occlusion, and a moderate-caliber rPL subtotal occlusion. He underwent PCI with DES to the mid LCx subtotal occlusion and the rPL subtotal occlusion  Last seen August 2017 at [facility], no cardiology follow-up since that time Now on Medicare  Notes from [facility] cardiology reviewed in detail on today's visit Cardiac catheterization Nov 2015 1. Coronary artery disease including: 99% subtotal occlusion mid LCx, CTO of superior branch of OM, subtotal occlusion of the inferior branch of the OM, subtotal occlusion of a medium caliber rPL 2. Grafts: patent LIMA to LAD, patent SVG to D, occluded SVG to OM occluded at the ostial anastomosis 3. Successful PCI to the midLCx/OM subtotal occlusion with placement of a Promus Premier 2.5 x 16 mm DES resulted 0% residual stenosis and TIMI 3 flow. 4. Successful PCI to the right PL branch with placement of a Promus Premier 2.25 x 32 mm DES resulted 0% residual stenosis and TIMI 3 flow. 5. Normal left heart filling pressure: LVEDP = 13 mm Hg 6. Normal LV contractile function on LV gram (EF>55%)   back surgery, permanent nerve injury  Reports having some worsening shortness  of breath Weight is up 20 pounds over the past year, no exercise Reports he has been laying on his bed for most of the day watching TV Has a mark on his elbow where he lays on it for such long periods of time  -Reports he is out of his cardiac medications statin ACE inhibitor and beta-blocker.  Has not had his medications for 8 months  EKG personally reviewed by myself on todays visit Shows sinus tachycardia rate 119 bpm nonspecific ST and T wave abnormality  Other records reviewed [facility]  September 2012 with chest pain and shortness of breath.  Cardiac catheterization showed severe multivessel disease and he was transferred to [facility] where he underwent bypass by Dr. Tyrone Sage on May 01 2011.   He had a coronary artery bypass grafting x4 (left internal mammary artery to left anterior descending, saphenous vein graft to diagonal, saphenous vein graft sequentially to first obtuse marginal artery and second obtuse marginal artery      EKG shows normal sinus rhythm with rate 82 beats per minute with no significant ST abnormality    PMH:   has a past medical history of Acute non-ST segment elevation myocardial infarction (HCC), Arthritis, CAD, multiple vessel, COPD (chronic obstructive pulmonary disease) (HCC), GERD (gastroesophageal reflux disease), History of heart artery stent, Hypercholesteremia, Hypertension, Levoscoliosis, Obstructive sleep apnea, and Tobacco abuse.  PSH:    Past Surgical History:  Procedure Laterality Date   BACK SURGERY     Degenerative Scoliosis T10-S1  CARDIAC CATHETERIZATION  2013   right leg   COLONOSCOPY WITH PROPOFOL N/A 02/05/2017   Procedure: COLONOSCOPY WITH PROPOFOL;  Surgeon: Christena Deem, MD;  Location: [facility] ENDOSCOPY;  Service: Endoscopy;  Laterality: N/A;   CORONARY ARTERY BYPASS GRAFT  05/01/2011   x 4   ESOPHAGOGASTRODUODENOSCOPY (EGD) WITH PROPOFOL N/A 02/05/2017   Procedure: ESOPHAGOGASTRODUODENOSCOPY (EGD) WITH PROPOFOL;   Surgeon: Christena Deem, MD;  Location: [facility] ENDOSCOPY;  Service: Endoscopy;  Laterality: N/A;   JOINT REPLACEMENT     KNEE SURGERY     right knee    Current Outpatient Medications  Medication Sig Dispense Refill   albuterol (VENTOLIN HFA) 108 (90 Base) MCG/ACT inhaler Inhale 2 puffs into the lungs every 6 (six) hours as needed for wheezing or shortness of breath. 8 g 1   aspirin EC 81 MG tablet Take 1 tablet (81 mg total) by mouth daily. Swallow whole. 90 tablet 3   cetirizine (ZYRTEC) 10 MG tablet Take 10 mg by mouth daily.     folic acid (FOLVITE) 1 MG tablet Take 1 mg by mouth daily.     lisinopril (ZESTRIL) 2.5 MG tablet Take 1 tablet (2.5 mg total) by mouth every evening. 90 tablet 3   pantoprazole (PROTONIX) 40 MG tablet Take 40 mg by mouth 2 (two) times daily.     Potassium 99 MG TABS Take 1 tablet by mouth daily.     rosuvastatin (CRESTOR) 20 MG tablet Take 1 tablet (20 mg total) by mouth every evening. 90 tablet 3   sucralfate (CARAFATE) 1 g tablet Take 1 g by mouth 2 (two) times daily.      tamsulosin (FLOMAX) 0.4 MG CAPS capsule Take 0.4 mg by mouth daily.     No current facility-administered medications for this visit.     Allergies:   Other, Tramadol, Codeine, and Vicodin [hydrocodone-acetaminophen]   Social History:  The patient  reports that he has been smoking cigarettes. He has a 25.00 pack-year smoking history. He has never used smokeless tobacco. He reports that he does not drink alcohol and does not use drugs.   Family History:   family history includes Heart attack in his mother.    Review of Systems: Review of Systems  Constitutional: Negative.   HENT: Negative.    Respiratory:  Positive for shortness of breath.   Cardiovascular: Negative.   Gastrointestinal: Negative.   Musculoskeletal: Negative.   Neurological: Negative.   Psychiatric/Behavioral: Negative.    All other systems reviewed and are negative.    PHYSICAL EXAM: VS:  There were no  vitals taken for this visit. , BMI There is no height or weight on file to calculate BMI. GEN: Well nourished, well developed, in no acute distress HEENT: normal Neck: no JVD, carotid bruits, or masses Cardiac: RRR; no murmurs, rubs, or gallops,no edema  Respiratory:  clear to auscultation bilaterally, normal work of breathing GI: soft, nontender, nondistended, + BS MS: no deformity or atrophy Skin: warm and dry, no rash Neuro:  Strength and sensation are intact Psych: euthymic mood, full affect    Recent Labs: 05/15/2022: BUN 23; Creatinine, Ser 1.25; Magnesium 1.9; Potassium 4.4; Sodium 143    Lipid Panel Lab Results  Component Value Date   CHOL 164 08/02/2011   HDL 37 (L) 08/02/2011   LDLCALC 99 08/02/2011   TRIG 140 08/02/2011      Wt Readings from Last 3 Encounters:  07/31/22 190 lb (86.2 kg)  05/15/22 184 lb (83.5 kg)  10/06/19  216 lb 12 oz (98.3 kg)      ASSESSMENT AND PLAN:  Problem List Items Addressed This Visit   None CAD with stable angina Medications refilled, has been off them for 8 months Weight up 20 pounds, recommended dietary changes and weight loss No plan for ischemic work-up at this time Recommended smoking cessation  Smoker We have encouraged him to continue to work on weaning his cigarettes and smoking cessation. He will continue to work on this and does not want any assistance with chantix.   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 [facility] [place], [place] [phone]

## 2023-01-30 ENCOUNTER — Ambulatory Visit: Payer: Medicare Other | Admitting: Cardiovascular Disease

## 2023-01-30 DIAGNOSIS — Z951 Presence of aortocoronary bypass graft: Secondary | ICD-10-CM

## 2023-01-30 DIAGNOSIS — E785 Hyperlipidemia, unspecified: Secondary | ICD-10-CM

## 2023-01-30 DIAGNOSIS — Z720 Tobacco use: Secondary | ICD-10-CM

## 2023-01-30 DIAGNOSIS — I2510 Atherosclerotic heart disease of native coronary artery without angina pectoris: Secondary | ICD-10-CM

## 2023-01-30 DIAGNOSIS — I10 Essential (primary) hypertension: Secondary | ICD-10-CM

## 2023-02-11 NOTE — Progress Notes (Unsigned)
Cardiology Office Note  Date:  02/12/2023   ID:  [name], [name] 1965-12-20, MRN [id number]  PCP:  Armando Gang, FNP   Chief Complaint  Patient presents with   6 month follow up     "Doing well." Medications reviewed by the patient verbally.     HPI:  57 year old male with past medical history of Smoking 1 ppd significant DJD, scoliosis, sciatica.  Chronic back pain, prior surgery CAD, bypass surgery x4 in 2012,  Who presents for follow-up of his coronary artery disease, smoking  Last seen by myself in clinic February 2021 Seen by one of our providers December 2023 for preop evaluation  Back surgery jan 2024, lower back  Nerve damage left foot Cut his thumb on washing machine, has bandage in place, lots of blood, did not seek medical attention, wife wrapped it up  Poor sleep Poor energy Has tried various sleep aids in the past, did not work  Denies significant chest pain concerning for angina  Still smoking 1 pack/day, discussed prior attempts to stop  EKG personally reviewed by myself on todays visit Normal sinus rhythm rate 82 bpm no significant ST-T wave changes  Other past medical history reviewed  Previously seen in our clinic, last seen 2014  lost his job and health insurance, applied for charity care, and was referred to [facility] and Vascular.  2015 was having chest pain coronary angiography revealed a patent LIMA to LAD, a patent SVG to diagonal, an occluded SVG to OM, a mid LCx 99% subtotal occlusion, and a moderate-caliber rPL subtotal occlusion. He underwent PCI with DES to the mid LCx subtotal occlusion and the rPL subtotal occlusion  Last seen August 2017 at [facility], no cardiology follow-up since that time Now on Medicare  Notes from [facility] cardiology reviewed in detail on today's visit Cardiac catheterization Nov 2015 1. Coronary artery disease including: 99% subtotal occlusion mid LCx, CTO of superior branch of OM, subtotal occlusion of the inferior  branch of the OM, subtotal occlusion of a medium caliber rPL 2. Grafts: patent LIMA to LAD, patent SVG to D, occluded SVG to OM occluded at the ostial anastomosis 3. Successful PCI to the midLCx/OM subtotal occlusion with placement of a Promus Premier 2.5 x 16 mm DES resulted 0% residual stenosis and TIMI 3 flow. 4. Successful PCI to the right PL branch with placement of a Promus Premier 2.25 x 32 mm DES resulted 0% residual stenosis and TIMI 3 flow. 5. Normal left heart filling pressure: LVEDP = 13 mm Hg 6. Normal LV contractile function on LV gram (EF>55%)   back surgery, permanent nerve injury  Reports having some worsening shortness of breath Weight is up 20 pounds over the past year, no exercise Reports he has been laying on his bed for most of the day watching TV Has a mark on his elbow where he lays on it for such long periods of time  -Reports he is out of his cardiac medications statin ACE inhibitor and beta-blocker.  Has not had his medications for 8 months  EKG personally reviewed by myself on todays visit Shows sinus tachycardia rate 119 bpm nonspecific ST and T wave abnormality  Other records reviewed [facility]  September 2012 with chest pain and shortness of breath.  Cardiac catheterization showed severe multivessel disease and he was transferred to [facility] where he underwent bypass by Dr. Tyrone Sage on May 01 2011.   He had a coronary artery bypass grafting x4 (left internal mammary  artery to left anterior descending, saphenous vein graft to diagonal, saphenous vein graft sequentially to first obtuse marginal artery and second obtuse marginal artery    PMH:   has a past medical history of Acute non-ST segment elevation myocardial infarction (HCC), Arthritis, CAD, multiple vessel, COPD (chronic obstructive pulmonary disease) (HCC), GERD (gastroesophageal reflux disease), History of heart artery stent, Hypercholesteremia, Hypertension, Levoscoliosis, Obstructive sleep  apnea, and Tobacco abuse.  PSH:    Past Surgical History:  Procedure Laterality Date   BACK SURGERY     Degenerative Scoliosis T10-S1    CARDIAC CATHETERIZATION  2013   right leg   COLONOSCOPY WITH PROPOFOL N/A 02/05/2017   Procedure: COLONOSCOPY WITH PROPOFOL;  Surgeon: Christena Deem, MD;  Location: [facility] ENDOSCOPY;  Service: Endoscopy;  Laterality: N/A;   CORONARY ARTERY BYPASS GRAFT  05/01/2011   x 4   ESOPHAGOGASTRODUODENOSCOPY (EGD) WITH PROPOFOL N/A 02/05/2017   Procedure: ESOPHAGOGASTRODUODENOSCOPY (EGD) WITH PROPOFOL;  Surgeon: Christena Deem, MD;  Location: [facility] ENDOSCOPY;  Service: Endoscopy;  Laterality: N/A;   JOINT REPLACEMENT     KNEE SURGERY     right knee    Current Outpatient Medications  Medication Sig Dispense Refill   albuterol (VENTOLIN HFA) 108 (90 Base) MCG/ACT inhaler Inhale 2 puffs into the lungs every 6 (six) hours as needed for wheezing or shortness of breath. 8 g 1   aspirin EC 81 MG tablet Take 1 tablet (81 mg total) by mouth daily. Swallow whole. 90 tablet 3   Budeson-Glycopyrrol-Formoterol (BREZTRI AEROSPHERE) 160-9-4.8 MCG/ACT AERO Inhale into the lungs daily as needed.     cetirizine (ZYRTEC) 10 MG tablet Take 10 mg by mouth daily.     folic acid (FOLVITE) 1 MG tablet Take 1 mg by mouth daily.     lisinopril (ZESTRIL) 2.5 MG tablet Take 1 tablet (2.5 mg total) by mouth every evening. 90 tablet 3   pantoprazole (PROTONIX) 40 MG tablet Take 40 mg by mouth 2 (two) times daily.     Potassium 99 MG TABS Take 1 tablet by mouth daily.     rosuvastatin (CRESTOR) 20 MG tablet Take 1 tablet (20 mg total) by mouth every evening. 90 tablet 3   sucralfate (CARAFATE) 1 g tablet Take 1 g by mouth 2 (two) times daily.      tamsulosin (FLOMAX) 0.4 MG CAPS capsule Take 0.4 mg by mouth daily.     No current facility-administered medications for this visit.     Allergies:   Other, Tramadol, Codeine, and Vicodin [hydrocodone-acetaminophen]   Social History:   The patient  reports that he has been smoking cigarettes. He has a 25.00 pack-year smoking history. He has never used smokeless tobacco. He reports that he does not drink alcohol and does not use drugs.   Family History:   family history includes Heart attack in his mother.    Review of Systems: Review of Systems  Constitutional: Negative.   HENT: Negative.    Respiratory:  Positive for shortness of breath.   Cardiovascular: Negative.   Gastrointestinal: Negative.   Musculoskeletal: Negative.   Neurological: Negative.   Psychiatric/Behavioral: Negative.    All other systems reviewed and are negative.   PHYSICAL EXAM: VS:  BP 120/68 (BP Location: Left Arm, Patient Position: Sitting, Cuff Size: Normal)   Pulse 82   Ht 5\' 9"  (1.753 m)   Wt 195 lb (88.5 kg)   SpO2 91%   BMI 28.80 kg/m  , BMI Body mass index is 28.8 kg/m.  GEN: Well nourished, well developed, in no acute distress HEENT: normal Neck: no JVD, carotid bruits, or masses Cardiac: RRR; no murmurs, rubs, or gallops,no edema  Respiratory:  clear to auscultation bilaterally, normal work of breathing GI: soft, nontender, nondistended, + BS MS: no deformity or atrophy Skin: warm and dry, no rash Neuro:  Strength and sensation are intact Psych: euthymic mood, full affect   Recent Labs: 05/15/2022: BUN 23; Creatinine, Ser 1.25; Magnesium 1.9; Potassium 4.4; Sodium 143    Lipid Panel Lab Results  Component Value Date   CHOL 164 08/02/2011   HDL 37 (L) 08/02/2011   LDLCALC 99 08/02/2011   TRIG 140 08/02/2011      Wt Readings from Last 3 Encounters:  02/12/23 195 lb (88.5 kg)  07/31/22 190 lb (86.2 kg)  05/15/22 184 lb (83.5 kg)     ASSESSMENT AND PLAN:  Problem List Items Addressed This Visit       Cardiology Problems   Hyperlipidemia LDL goal <70   CAD (coronary artery disease), native coronary artery - Primary     Other   Chronic 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 No further workup at this time. Continue current medication regimen. Discussed anginal symptoms to watch for, medication refills provided Smoking cessation recommended  Smoker More than 5 minutes spent discussing smoking cessation techniques, we have encouraged him to continue to work on weaning his cigarettes and smoking cessation. He will continue to work on this and does not want any assistance with chantix.    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 [facility] [place], [place] [phone]

## 2023-02-12 ENCOUNTER — Ambulatory Visit: Payer: Medicare Other | Attending: Cardiovascular Disease | Admitting: Cardiovascular Disease

## 2023-02-12 ENCOUNTER — Encounter: Payer: Self-pay | Admitting: Cardiovascular Disease

## 2023-02-12 VITALS — BP 120/68 | HR 82 | Ht 69.0 in | Wt 195.0 lb

## 2023-02-12 DIAGNOSIS — Z720 Tobacco use: Secondary | ICD-10-CM | POA: Insufficient documentation

## 2023-02-12 DIAGNOSIS — I10 Essential (primary) hypertension: Secondary | ICD-10-CM | POA: Insufficient documentation

## 2023-02-12 DIAGNOSIS — Z951 Presence of aortocoronary bypass graft: Secondary | ICD-10-CM | POA: Diagnosis present

## 2023-02-12 DIAGNOSIS — E785 Hyperlipidemia, unspecified: Secondary | ICD-10-CM | POA: Diagnosis present

## 2023-02-12 DIAGNOSIS — M79605 Pain in left leg: Secondary | ICD-10-CM | POA: Insufficient documentation

## 2023-02-12 DIAGNOSIS — G8929 Other chronic pain: Secondary | ICD-10-CM | POA: Diagnosis present

## 2023-02-12 DIAGNOSIS — I2510 Atherosclerotic heart disease of native coronary artery without angina pectoris: Secondary | ICD-10-CM | POA: Diagnosis present

## 2023-02-12 NOTE — Patient Instructions (Signed)
Medication Instructions:  No changes  If you need a refill on your cardiac medications before your next appointment, please call your pharmacy.   Lab work: No new labs needed  Testing/Procedures: No new testing needed  Follow-Up: At [facility], you and your health needs are our priority.  As part of our continuing mission to provide you with exceptional heart care, we have created designated Provider Care Teams.  These Care Teams include your primary Cardiologist (physician) and Advanced Practice Providers (APPs -  Physician Assistants and Nurse Practitioners) who all work together to provide you with the care you need, when you need it.  You will need a follow up appointment in 12 months  Providers on your designated Care Team:   Christopher Berge, NP Ryan Dunn, PA-C Cadence Furth, PA-C  COVID-19 Vaccine Information can be found at: [email or web address] For questions related to vaccine distribution or appointments, please email [email or web address] or call [phone].   

## 2023-03-16 ENCOUNTER — Encounter: Payer: Self-pay | Admitting: Physician Assistant

## 2024-07-31 NOTE — Progress Notes (Deleted)
 Cardiology Office Note    Date:  07/31/2024   ID:  [name], DOB 09/15/1965, MRN [id number]  PCP:  Donal Channing SQUIBB, FNP  Cardiologist:  Evalene Lunger, MD  Electrophysiologist:  None   Chief Complaint: Follow-up  History of Present Illness:   [name] is a 58 y.o. male with history of CAD status post four-vessel CABG (LIMA to LAD, SVG to diagonal, and SVG sequentially to OM1/OM2) in 2012 in the setting of an MI status post PCI to the mid LCx/OM and right PL branch in 2015 at [facility], HTN, HLD, ongoing tobacco use, and chronic back pain including scoliosis and lumbar fusion who presents for follow-up of CAD.  He was admitted to [facility] in 04/2011 with an MI.  LHC showed multivessel CAD.  He was transferred to [facility] and underwent 4-vessel bypass.  Following discharge, he was followed by Dr. Gollan 05/2013.  After this, he had lost his job and health insurance and was subsequently referred to [facility] cardiology in 2015.  In the setting of exertional and rest angina he underwent LHC at [facility] in 06/2014 showed severe native vessel CAD including 99% subtotal occlusion of the mid LCx, CTO of a superior branch of OM, subtotal occlusion of the inferior branch of OM, subtotal occlusion of RPL.  Patent LIMA to LAD and SVG to diagonal with an occluded SVG to sequential OM at the ostial anastomosis.  He underwent successful PCI/DES to the native mid LCx/OM as well as PCI/DES to the RPL branch.  Normal LVEF.  Echo in 2017 showed an EF of 60 to 65%, no regional wall motion abnormalities, normal LV diastolic function parameters, mildly dilated left atrium, normal RV systolic function and PASP.  Has previously ran out of medications for several months.  He was last seen in the office in 01/2023 and was without symptoms of angina or cardiac decompensation with no changes indicated at that time.  ***   Labs independently reviewed: 03/2024 - Hgb 15.1, PLT 220, potassium 5.6, BUN 27, serum creatinine 1.1,  albumin 4.3, AST/ALT normal, A1c 6.7, TC 118, TG 125, HDL 42, LDL 51, TSH normal, magnesium  2.0  Past Medical History:  Diagnosis Date   Acute non-ST segment elevation myocardial infarction (HCC)    Arthritis    CAD, multiple vessel    COPD (chronic obstructive pulmonary disease) (HCC)    GERD (gastroesophageal reflux disease)    History of heart artery stent    Hypercholesteremia    Hypertension    Levoscoliosis    multi level degenrative disc disease   Obstructive sleep apnea    Tobacco abuse     Past Surgical History:  Procedure Laterality Date   BACK SURGERY     Degenerative Scoliosis T10-S1    CARDIAC CATHETERIZATION  2013   right leg   COLONOSCOPY WITH PROPOFOL  N/A 02/05/2017   Procedure: COLONOSCOPY WITH PROPOFOL ;  Surgeon: Gaylyn Gladis PENNER, MD;  Location: [facility] ENDOSCOPY;  Service: Endoscopy;  Laterality: N/A;   CORONARY ARTERY BYPASS GRAFT  05/01/2011   x 4   ESOPHAGOGASTRODUODENOSCOPY (EGD) WITH PROPOFOL  N/A 02/05/2017   Procedure: ESOPHAGOGASTRODUODENOSCOPY (EGD) WITH PROPOFOL ;  Surgeon: Gaylyn Gladis PENNER, MD;  Location: [facility] ENDOSCOPY;  Service: Endoscopy;  Laterality: N/A;   JOINT REPLACEMENT     KNEE SURGERY     right knee    Current Medications: Active Medications[1]  Allergies:   Other, Tramadol , Codeine, and Vicodin [hydrocodone -acetaminophen ]   Social History   Socioeconomic History  Marital status: Married    Spouse name: Not on file   Number of children: Not on file   Years of education: Not on file   Highest education level: Not on file  Occupational History   Not on file  Tobacco Use   Smoking status: Every Day    Current packs/day: 0.00    Average packs/day: 1 pack/day for 25.0 years (25.0 ttl pk-yrs)    Types: Cigarettes    Start date: 04/27/1986    Last attempt to quit: 04/28/2011    Years since quitting: 13.2   Smokeless tobacco: Never  Vaping Use   Vaping status: Never Used  Substance and Sexual Activity   Alcohol use: No   Drug  use: No   Sexual activity: Not on file  Other Topics Concern   Not on file  Social History Narrative   Not on file   Social Drivers of Health   Tobacco Use: High Risk (03/21/2024)   Received from [facility]   Patient History    Smoking Tobacco Use: Every Day    Smokeless Tobacco Use: Never    Passive Exposure: Past  Financial Resource Strain: Low Risk  (03/21/2024)   Received from [facility]   Overall Financial Resource Strain (CARDIA)    Difficulty of Paying Living Expenses: Not very hard  Food Insecurity: No Food Insecurity (03/21/2024)   Received from [facility]   Epic    Within the past 12 months, you worried that your food would run out before you got the money to buy more.: Never true    Within the past 12 months, the food you bought just didn't last and you didn't have money to get more.: Never true  Transportation Needs: No Transportation Needs (03/21/2024)   Received from [facility] - Transportation    In the past 12 months, has lack of transportation kept you from medical appointments or from getting medications?: No    Lack of Transportation (Non-Medical): No  Physical Activity: Inactive (03/21/2024)   Received from [facility]   Exercise Vital Sign    On average, how many days per week do you engage in moderate to strenuous exercise (like a brisk walk)?: 0 days    On average, how many minutes do you engage in exercise at this level?: 0 min  Stress: No Stress Concern Present (03/21/2024)   Received from [facility] - Occupational Stress Questionnaire    Feeling of Stress : Only a little  Social Connections: Moderately Integrated (03/21/2024)   Received from [facility]   Social Connection and Isolation Panel    In a typical week, how many times do you talk on the phone with family, friends, or  neighbors?: More than three times a week    How often do you get together with friends or relatives?: Once a week    How often do you attend church or religious services?: More than 4 times per year    Do you belong to any clubs or organizations such as church groups, unions, fraternal or athletic groups, or school groups?: No    How often do you attend meetings of the clubs or organizations you belong to?: Never    Are you married, widowed, divorced, separated, never married, or living with a partner?: Married  Depression (PHQ2-9): Low Risk (05/15/2022)   Depression (PHQ2-9)  PHQ-2 Score: 0  Alcohol Screen: Not on file  Housing: Low Risk  (03/21/2024)   Received from [facility]   Epic    In the last 12 months, was there a time when you were not able to pay the mortgage or rent on time?: No    In the past 12 months, how many times have you moved where you were living?: 0    At any time in the past 12 months, were you homeless or living in a shelter (including now)?: No  Utilities: Not At Risk (03/21/2024)   Received from [facility]   Epic    In the past 12 months has the electric, gas, oil, or water company threatened to shut off services in your home?: No  Health Literacy: Adequate Health Literacy (03/21/2024)   Received from [facility]   B1300 Health Literacy    How often do you need to have someone help you when you read instructions, pamphlets, or other written material from your doctor or pharmacy?: Never     Family History:  The patient's family history includes Heart attack in his mother.  ROS:   12-point review of systems is negative unless otherwise noted in the HPI.   EKGs/Labs/Other Studies Reviewed:    Studies reviewed were summarized above. The additional studies were reviewed today:  2D echo 05/16/2016: - Left ventricle: The cavity size was normal. Systolic function was    normal. The estimated ejection fraction  was in the range of 60%    to 65%. Wall motion was normal; there were no regional wall    motion abnormalities. Left ventricular diastolic function    parameters were normal.  - Left atrium: The atrium was mildly dilated.  - Right ventricle: Systolic function was normal.  - Pulmonary arteries: Systolic pressure was within the normal    range.  __________  [facility] 07/10/2014 [facility]): CORONARY ANGIOGRAPHY  The left main (LMCA)  is a large-caliber  vessel that  originates from  the left coronary  sinus. It bifurcates  into the left  anterior  descending (LAD)  and left circumflex  (LCx) arteries.  There is no  angiographic evidence  of significant  disease in the  LMCA.  The LAD is a large-caliber  vessel  that gives off two  medium-sized  diagonal (D) branches  before it wraps  around the apex.  There is  complete total occlusion  in the mid  LAD. The LAD is  filled by a  patent LIMA graft.  The LCx is a medium-caliber  vessel  that gives off three  medium-sized  obtuse marginal (OM)  branches and  then continues as  a small vessel  in the AV groove.  There is 99% subtotal  occlusion mid  LCx at the  bifurcation site  of OM which led to  a complete total  occlusion of  the superior branch  of OM (retrograde  flow) and subtotal  occlusion  of the inferior branch  of the OM (forward  flow).   The right coronary  artery (RCA) is  a large-caliber  vessel  originating from  the right coronary  sinus. It bifurcates  distally  into the posterior  descending artery  (PDA) and a posterolateral  branch (PL) consistent  with a right  dominant system.  There is 30%  mid RCA stenosis,  subtotal occlusion  of a medium caliber  PL branch  with distal  branches  filled by bridging  collaterals  and left to  right collaterals.  The right PDA has  no significant  disease.    Grafts  LIMA-to-LAD: widely  patent.  SVG-to-Diag: patent.  SVG-to-OM: occluded  at the ostial  anastomosis (based  on the   patient's report,  he had 4 vessel  bypass, it could  be a jump graft  for the OM branches).    LEFT VENTRICULOGRAPHY  Normal left ventricular  contractile  function (EF >55%)    COMPLICATIONS: No  complications   SUMMARY FINDINGS:  Successful PCI      1. Coronary  artery disease  as detailed above  including: 99%      subtotal  occlusion mid  LCx, CTO of superior  branch of OM,      subtotal  occlusion of the  inferior branch  of the OM, 30%  mid RCA      stenosis,  , subtotal occlusion  of a medium  caliber PL branch  and      occluded  SVG-OM graft      2. Grafts:  LIMA-to-LAD:  widely patent; SVG-to-Diag:  patent;  SVG-      to-OM:  occluded at the  ostial anastomosis  (based on the      patient's  report, he had  4 vessel bypass,  it could be a jump      graft  for the OM branches).      3. Successful  PCI to the  midLCx/OM subtotal  occlusion with      placement  of a Promus Premier  2.5 x 16 mm  DES resulted 0%      residual  stenosis and TIMI  3 flow.      4. Successful  PCI to the  right PL branch  with placement of  a      Promus  Premier 2.25 x 32  mm DES resulted  0% residual stenosis      and TIMI  3 flow.      5. Normal  left heart filling  pressure: LVEDP  = 13 mm Hg  PLAN/DISPOSITION AND  FOLLOWUP:      Post PCI  routine with femoral  sheath protocol.      Dual antiplatelet  therapy  with ASA and clopidogrel .      Clopidogrel   response gene  CYP2C19 genotyping      These  findings were discussed  with the patient,  family, and      requesting  physician.    EKG:  EKG is ordered today.  The EKG ordered today demonstrates ***  Recent Labs: No results found for requested labs within last 365 days.  Recent Lipid Panel    Component Value Date/Time   CHOL 164 08/02/2011 0822   TRIG 140 08/02/2011 0822   HDL 37 (L) 08/02/2011 0822   CHOLHDL 4.4 08/02/2011 0822   CHOLHDL 5.1 04/30/2011 0705   VLDL 23 04/30/2011 0705   LDLCALC 99 08/02/2011 0822     PHYSICAL EXAM:    VS:  There were no vitals taken for this visit.  BMI: There is no height or weight on file to calculate BMI.  Physical Exam Constitutional:      Appearance: He is well-developed.  HENT:     Head: Normocephalic and atraumatic.  Eyes:     General:        Right eye: No discharge.  Left eye: No discharge.  Cardiovascular:     Rate and Rhythm: Normal rate and regular rhythm.     Heart sounds: Normal heart sounds, S1 normal and S2 normal. Heart sounds not distant. No midsystolic click and no opening snap. No murmur heard.    No friction rub.  Pulmonary:     Effort: Pulmonary effort is normal. No respiratory distress.     Breath sounds: Normal breath sounds. No decreased breath sounds, wheezing, rhonchi or rales.  Musculoskeletal:     Cervical back: Normal range of motion.  Skin:    General: Skin is warm and dry.     Nails: There is no clubbing.  Neurological:     Mental Status: He is alert and oriented to person, place, and time.  Psychiatric:        Speech: Speech normal.        Behavior: Behavior normal.        Thought Content: Thought content normal.        Judgment: Judgment normal.     Wt Readings from Last 3 Encounters:  02/12/23 195 lb (88.5 kg)  07/31/22 190 lb (86.2 kg)  05/15/22 184 lb (83.5 kg)     ASSESSMENT & PLAN:   CAD status post CABG without angina:  HTN: Blood pressure  HLD: LDL 51 in 03/2024 with normal AST/ALT at that time.  Ongoing tobacco use:   {Are you ordering a CV Procedure (e.g. stress test, cath, DCCV, TEE, etc)?   Press F2        :[id number]}     Disposition: F/u with Dr. Gollan or an APP in ***.   Medication Adjustments/Labs and Tests Ordered: Current medicines are reviewed at length with the patient today.  Concerns regarding medicines are outlined above. Medication changes, Labs and Tests ordered today are summarized above and listed in the Patient Instructions accessible in Encounters.   Signed, Bernardino Bring, PA-C 07/31/2024 4:06 PM     [facility] [place] [address] [place], [place] [address] [phone]     [1]  No outpatient medications have been marked as taking for the 08/07/24 encounter (Appointment) with Bring Bernardino HERO, PA-C.

## 2024-08-07 ENCOUNTER — Ambulatory Visit: Attending: Physician Assistant | Admitting: Physician Assistant

## 2024-08-07 DIAGNOSIS — Z951 Presence of aortocoronary bypass graft: Secondary | ICD-10-CM

## 2024-08-07 DIAGNOSIS — I2510 Atherosclerotic heart disease of native coronary artery without angina pectoris: Secondary | ICD-10-CM

## 2024-08-07 DIAGNOSIS — E785 Hyperlipidemia, unspecified: Secondary | ICD-10-CM

## 2024-08-07 DIAGNOSIS — Z720 Tobacco use: Secondary | ICD-10-CM

## 2024-08-07 DIAGNOSIS — I10 Essential (primary) hypertension: Secondary | ICD-10-CM
